# Patient Record
Sex: Female | Born: 1970 | Race: White | Hispanic: No | Marital: Married | Smoking: Never smoker
Health system: Southern US, Community
[De-identification: ages and names within clinical notes are randomized; demographics above are authoritative.]

## PROBLEM LIST (undated history)

## (undated) DIAGNOSIS — J8410 Pulmonary fibrosis, unspecified: Secondary | ICD-10-CM

## (undated) DIAGNOSIS — N200 Calculus of kidney: Secondary | ICD-10-CM

## (undated) DIAGNOSIS — J45909 Unspecified asthma, uncomplicated: Secondary | ICD-10-CM

## (undated) DIAGNOSIS — I10 Essential (primary) hypertension: Secondary | ICD-10-CM

## (undated) DIAGNOSIS — F32A Depression, unspecified: Secondary | ICD-10-CM

## (undated) HISTORY — PX: CHOLECYSTECTOMY: SHX55

## (undated) HISTORY — DX: Pulmonary fibrosis, unspecified: J84.10

## (undated) HISTORY — PX: CERVICAL SPINE SURGERY: SHX589

## (undated) HISTORY — DX: Calculus of kidney: N20.0

## (undated) HISTORY — PX: OTHER SURGICAL HISTORY: SHX169

## (undated) HISTORY — PX: BLADDER SURGERY: SHX569

## (undated) HISTORY — PX: CARPAL TUNNEL RELEASE: SHX101

## (undated) HISTORY — PX: LITHOTRIPSY: SUR834

---

## 2015-06-22 DIAGNOSIS — E781 Pure hyperglyceridemia: Secondary | ICD-10-CM | POA: Insufficient documentation

## 2015-06-22 DIAGNOSIS — F411 Generalized anxiety disorder: Secondary | ICD-10-CM | POA: Insufficient documentation

## 2015-06-22 DIAGNOSIS — F5101 Primary insomnia: Secondary | ICD-10-CM | POA: Insufficient documentation

## 2015-08-30 DIAGNOSIS — R519 Headache, unspecified: Secondary | ICD-10-CM | POA: Insufficient documentation

## 2015-12-17 DIAGNOSIS — E039 Hypothyroidism, unspecified: Secondary | ICD-10-CM | POA: Insufficient documentation

## 2016-05-19 DIAGNOSIS — N200 Calculus of kidney: Secondary | ICD-10-CM | POA: Insufficient documentation

## 2016-05-19 DIAGNOSIS — N3281 Overactive bladder: Secondary | ICD-10-CM | POA: Insufficient documentation

## 2016-06-10 DIAGNOSIS — E8941 Symptomatic postprocedural ovarian failure: Secondary | ICD-10-CM | POA: Insufficient documentation

## 2016-06-10 DIAGNOSIS — Z803 Family history of malignant neoplasm of breast: Secondary | ICD-10-CM | POA: Insufficient documentation

## 2017-01-07 DIAGNOSIS — I10 Essential (primary) hypertension: Secondary | ICD-10-CM | POA: Insufficient documentation

## 2017-12-15 DIAGNOSIS — R102 Pelvic and perineal pain: Secondary | ICD-10-CM | POA: Insufficient documentation

## 2017-12-15 DIAGNOSIS — G8929 Other chronic pain: Secondary | ICD-10-CM | POA: Insufficient documentation

## 2018-08-04 HISTORY — PX: OTHER SURGICAL HISTORY: SHX169

## 2018-11-29 DIAGNOSIS — K5900 Constipation, unspecified: Secondary | ICD-10-CM | POA: Insufficient documentation

## 2019-04-22 ENCOUNTER — Other Ambulatory Visit: Payer: Self-pay

## 2019-04-22 MED ORDER — ESCITALOPRAM OXALATE 20 MG PO TABS: 20.0000 mg | ORAL_TABLET | Freq: Every day | ORAL | 0 refills | Status: DC

## 2019-04-25 ENCOUNTER — Ambulatory Visit (INDEPENDENT_AMBULATORY_CARE_PROVIDER_SITE_OTHER): Payer: BLUE CROSS/BLUE SHIELD | Admitting: Adult Health

## 2019-04-25 ENCOUNTER — Encounter: Payer: Self-pay | Admitting: Adult Health

## 2019-04-25 ENCOUNTER — Other Ambulatory Visit: Payer: Self-pay

## 2019-04-25 DIAGNOSIS — F331 Major depressive disorder, recurrent, moderate: Secondary | ICD-10-CM | POA: Diagnosis not present

## 2019-04-25 DIAGNOSIS — F411 Generalized anxiety disorder: Secondary | ICD-10-CM

## 2019-04-25 DIAGNOSIS — G4700 Insomnia, unspecified: Secondary | ICD-10-CM | POA: Diagnosis not present

## 2019-04-25 DIAGNOSIS — F909 Attention-deficit hyperactivity disorder, unspecified type: Secondary | ICD-10-CM

## 2019-04-25 MED ORDER — BUPROPION HCL ER (XL) 300 MG PO TB24: ORAL_TABLET | ORAL | 5 refills | Status: DC

## 2019-04-25 MED ORDER — AMPHETAMINE-DEXTROAMPHETAMINE 20 MG PO TABS: 20.0000 mg | ORAL_TABLET | Freq: Every day | ORAL | 0 refills | Status: DC

## 2019-04-25 MED ORDER — ESCITALOPRAM OXALATE 20 MG PO TABS: 20.0000 mg | ORAL_TABLET | Freq: Every day | ORAL | 5 refills | Status: DC

## 2019-04-25 MED ORDER — CLONAZEPAM 0.5 MG PO TABS: ORAL_TABLET | ORAL | 2 refills | Status: DC

## 2019-04-25 MED ORDER — QUETIAPINE FUMARATE 100 MG PO TABS: ORAL_TABLET | ORAL | 5 refills | Status: DC

## 2019-04-25 NOTE — Progress Notes (Signed)
Olivia [name] [id number] 03/18/1971 48 y.o.  Virtual Visit via Telephone Note  I connected with pt on 04/25/19 at  3:00 PM EDT by telephone and verified that I am speaking with the correct person using two identifiers.   I discussed the limitations, risks, security and privacy concerns of performing an evaluation and management service by telephone and the availability of in person appointments. I also discussed with the patient that there may be a patient responsible charge related to this service. The patient expressed understanding and agreed to proceed.   I discussed the assessment and treatment plan with the patient. The patient was provided an opportunity to ask questions and all were answered. The patient agreed with the plan and demonstrated an understanding of the instructions.   The patient was advised to call back or seek an in-person evaluation if the symptoms worsen or if the condition fails to improve as anticipated.  I provided 30 minutes of non-face-to-face time during this encounter.  The patient was located at home.  The provider was located at [facility].   Dorothyann Gibbs, NP   Subjective:   Patient ID:  Olivia [name] is a 48 y.o. (DOB [birth year]) female.  Chief Complaint: No chief complaint on file.   HPI Taleasha Luttrull presents for follow-up of anxiety, depression, ADHD, and insomnia.   Describes mood today as "ok". Pleasant. Mood symptoms - depression, anxiety, and irritability. Stating "I'm under a lot stress right now". Increased financial stressors. Business closed for 3 months. Trying to "get back on my feet again" Stable interest and motivation. Taking medications as prescribed.  Energy levels stable. Active, does not have a regular exercise routine. Works full-time as a Producer, television/film/video. Enjoys some usual interests and activities. Spending time with family - husband and children. Talking to family and friends.  Appetite adequate. Weight stable. Sleeps  well most nights. Averages 6 to 8 hours. Focus and concentration stable - uses Adderall two days a week when doing office work. Completing tasks. Managing aspects of household.  Denies SI or HI. Denies AH or VH.  Review of Systems:  Review of Systems  Musculoskeletal: Negative for gait problem.  Neurological: Negative for tremors.  Psychiatric/Behavioral:       Please refer to HPI    Medications: I have reviewed the patient's current medications.  Current Outpatient Medications  Medication Sig Dispense Refill  . escitalopram (LEXAPRO) 20 MG tablet take 1 tablet by mouth once daily    . amphetamine-dextroamphetamine (ADDERALL) 20 MG tablet Take 1 tablet (20 mg total) by mouth daily. 30 tablet 0  . buPROPion (WELLBUTRIN XL) 300 MG 24 hr tablet TK 1 T PO D 30 tablet 5  . clonazePAM (KLONOPIN) 0.5 MG tablet TK 1 T PO TID PRN 90 tablet 2  . escitalopram (LEXAPRO) 20 MG tablet Take 1 tablet (20 mg total) by mouth daily. 30 tablet 5  . estradiol (ESTRACE) 1 MG tablet     . estradiol (VIVELLE-DOT) 0.025 MG/24HR     . NP THYROID 120 MG tablet TK 1 T PO QD OES    . progesterone (PROMETRIUM) 100 MG capsule     . QUEtiapine (SEROQUEL) 100 MG tablet TK 1-2 T PO PRN FOR SLEEP    . QUEtiapine (SEROQUEL) 100 MG tablet Take one to two tablets at bedtime. 60 tablet 5  . TRULANCE 3 MG TABS      No current facility-administered medications for this visit.     Medication Side Effects: None  Allergies: Not on File  No past medical history on file.  No family history on file.  Social History   Socioeconomic History  . Marital status: Unknown    Spouse name: Not on file  . Number of children: Not on file  . Years of education: Not on file  . Highest education level: Not on file  Occupational History  . Not on file  Social Needs  . Financial resource strain: Not on file  . Food insecurity    Worry: Not on file    Inability: Not on file  . Transportation needs    Medical: Not on file     Non-medical: Not on file  Tobacco Use  . Smoking status: Not on file  Substance and Sexual Activity  . Alcohol use: Not on file  . Drug use: Not on file  . Sexual activity: Not on file  Lifestyle  . Physical activity    Days per week: Not on file    Minutes per session: Not on file  . Stress: Not on file  Relationships  . Social Herbalist on phone: Not on file    Gets together: Not on file    Attends religious service: Not on file    Active member of club or organization: Not on file    Attends meetings of clubs or organizations: Not on file    Relationship status: Not on file  . Intimate partner violence    Fear of current or ex partner: Not on file    Emotionally abused: Not on file    Physically abused: Not on file    Forced sexual activity: Not on file  Other Topics Concern  . Not on file  Social History Narrative  . Not on file    Past Medical History, Surgical history, Social history, and Family history were reviewed and updated as appropriate.   Please see review of systems for further details on the patient's review from today.   Objective:   Physical Exam:  There were no vitals taken for this visit.  Physical Exam Constitutional:      General: She is not in acute distress.    Appearance: She is well-developed.  Musculoskeletal:        General: No deformity.  Neurological:     Mental Status: She is alert and oriented to person, place, and time.     Coordination: Coordination normal.  Psychiatric:        Attention and Perception: Attention and perception normal. She does not perceive auditory or visual hallucinations.        Mood and Affect: Mood normal. Mood is not anxious or depressed. Affect is not labile, blunt, angry or inappropriate.        Speech: Speech normal.        Behavior: Behavior normal.        Thought Content: Thought content normal. Thought content is not paranoid or delusional. Thought content does not include homicidal or  suicidal ideation. Thought content does not include homicidal or suicidal plan.        Cognition and Memory: Cognition and memory normal.        Judgment: Judgment normal.     Comments: Insight intact     Lab Review:  No results found for: NA, K, CL, CO2, GLUCOSE, BUN, CREATININE, CALCIUM, PROT, ALBUMIN, AST, ALT, ALKPHOS, BILITOT, GFRNONAA, GFRAA  No results found for: WBC, RBC, HGB, HCT, PLT, MCV, MCH, MCHC, RDW, LYMPHSABS, MONOABS,  EOSABS, BASOSABS  No results found for: POCLITH, LITHIUM   No results found for: PHENYTOIN, PHENOBARB, VALPROATE, CBMZ   .res Assessment: Plan:    Plan:  1. Lexapro 20mg  daily 2. Seroquel 100mg  - 2 at hs  3. Clonazepam 0.5mg  TID 4. Wellbutrin XL 300mg  daily 5. Adderall 20mg  daily  RTC 3/6 months  Patient advised to contact office with any questions, adverse effects, or acute worsening in signs and symptoms.  Discussed potential benefits, risk, and side effects of benzodiazepines to include potential risk of tolerance and dependence, as well as possible drowsiness.  Advised patient not to drive if experiencing drowsiness and to take lowest possible effective dose to minimize risk of dependence and tolerance.  Discussed potential metabolic side effects associated with atypical antipsychotics, as well as potential risk for movement side effects. Advised pt to contact office if movement side effects occur.   Discussed potential benefits, risks, and side effects of stimulants with patient to include increased heart rate, palpitations, insomnia, increased anxiety, increased irritability, or decreased appetite.  Instructed patient to contact office if experiencing any significant tolerability issues.  Diagnoses and all orders for this visit:  Generalized anxiety disorder -     escitalopram (LEXAPRO) 20 MG tablet; Take 1 tablet (20 mg total) by mouth daily. -     clonazePAM (KLONOPIN) 0.5 MG tablet; TK 1 T PO TID PRN  Major depressive disorder,  recurrent episode, moderate (HCC) -     escitalopram (LEXAPRO) 20 MG tablet; Take 1 tablet (20 mg total) by mouth daily. -     buPROPion (WELLBUTRIN XL) 300 MG 24 hr tablet; TK 1 T PO D  Insomnia, unspecified type -     QUEtiapine (SEROQUEL) 100 MG tablet; Take one to two tablets at bedtime.  Attention deficit hyperactivity disorder (ADHD), unspecified ADHD type -     amphetamine-dextroamphetamine (ADDERALL) 20 MG tablet; Take 1 tablet (20 mg total) by mouth daily.    Please see After Visit Summary for patient specific instructions.  No future appointments.  No orders of the defined types were placed in this encounter.     -------------------------------

## 2019-08-02 ENCOUNTER — Other Ambulatory Visit: Payer: Self-pay | Admitting: Adult Health

## 2019-08-02 DIAGNOSIS — G4700 Insomnia, unspecified: Secondary | ICD-10-CM

## 2019-08-02 MED ORDER — QUETIAPINE FUMARATE 100 MG PO TABS: ORAL_TABLET | ORAL | 5 refills | Status: DC

## 2019-08-02 NOTE — Telephone Encounter (Signed)
Script sent  

## 2019-08-02 NOTE — Telephone Encounter (Signed)
Olivia [name] called to request refill of her Seroquel.  She is taking 2-3 qHS vs 1 qHS.  So she is out early and can't refill without new prescription.  Please send in new script with new dose to [facility] on [address] in [place].  No appt scheduled at this time.

## 2019-10-26 ENCOUNTER — Encounter: Payer: Self-pay | Admitting: Adult Health

## 2019-10-26 ENCOUNTER — Other Ambulatory Visit: Payer: Self-pay

## 2019-10-26 ENCOUNTER — Ambulatory Visit (INDEPENDENT_AMBULATORY_CARE_PROVIDER_SITE_OTHER): Payer: BLUE CROSS/BLUE SHIELD | Admitting: Adult Health

## 2019-10-26 DIAGNOSIS — F331 Major depressive disorder, recurrent, moderate: Secondary | ICD-10-CM

## 2019-10-26 DIAGNOSIS — F411 Generalized anxiety disorder: Secondary | ICD-10-CM

## 2019-10-26 DIAGNOSIS — G4700 Insomnia, unspecified: Secondary | ICD-10-CM | POA: Diagnosis not present

## 2019-10-26 DIAGNOSIS — F909 Attention-deficit hyperactivity disorder, unspecified type: Secondary | ICD-10-CM

## 2019-10-26 MED ORDER — QUETIAPINE FUMARATE 100 MG PO TABS: ORAL_TABLET | ORAL | 5 refills | Status: DC

## 2019-10-26 MED ORDER — BUPROPION HCL ER (XL) 300 MG PO TB24: ORAL_TABLET | ORAL | 5 refills | Status: DC

## 2019-10-26 MED ORDER — CLONAZEPAM 0.5 MG PO TABS: ORAL_TABLET | ORAL | 2 refills | Status: DC

## 2019-10-26 MED ORDER — AMPHETAMINE-DEXTROAMPHETAMINE 20 MG PO TABS: 20.0000 mg | ORAL_TABLET | Freq: Every day | ORAL | 0 refills | Status: DC

## 2019-10-26 MED ORDER — ESCITALOPRAM OXALATE 20 MG PO TABS: 20.0000 mg | ORAL_TABLET | Freq: Every day | ORAL | 5 refills | Status: DC

## 2019-10-26 NOTE — Progress Notes (Signed)
Olivia [name] [id number] 1970-10-29 49 y.o.  Virtual Visit via Telephone Note  I connected with pt on 10/26/19 at  2:20 PM EDT by telephone and verified that I am speaking with the correct person using two identifiers.   I discussed the limitations, risks, security and privacy concerns of performing an evaluation and management service by telephone and the availability of in person appointments. I also discussed with the patient that there may be a patient responsible charge related to this service. The patient expressed understanding and agreed to proceed.   I discussed the assessment and treatment plan with the patient. The patient was provided an opportunity to ask questions and all were answered. The patient agreed with the plan and demonstrated an understanding of the instructions.   The patient was advised to call back or seek an in-person evaluation if the symptoms worsen or if the condition fails to improve as anticipated.  I provided 30 minutes of non-face-to-face time during this encounter.  The patient was located at home.  The provider was located at [facility].   Dorothyann Gibbs, NP   Subjective:   Patient ID:  Olivia [name] is a 49 y.o. (DOB Apr 28, 1971) female.  Chief Complaint: No chief complaint on file.   HPI [name] presents for follow-up of anxiety, depression, ADHD, and insomnia.   Describes mood today as "ok". Pleasant. Mood symptoms - reports depression, anxiety, and irritability "sometimes".  Stating "I'm mostly doing alright". Recent fall with broken ribs. 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 Daughter going through a divorce. Financial stressors. Stable interest and motivation. Taking medications as prescribed.  Energy levels stable. Active, does not have a regular exercise routine currently, but had been going to the gym until her recent fall. Works full-time as a Producer, television/film/video. Enjoys some usual interests and activities. Married.  Lives with husband of 32 years. 2 daughters. Talking to family and friends.  Appetite adequate. Weight stable - 153. Sleeps well most nights. Averages 7 to 8 hours. Focus and concentration stable with Adderall. Completing tasks. Managing aspects of household. Work going well.  Denies SI or HI. Denies AH or VH.  Review of Systems:  Review of Systems  Musculoskeletal: Negative for gait problem.  Neurological: Negative for tremors.  Psychiatric/Behavioral:       Please refer to HPI    Medications: I have reviewed the patient's current medications.  Current Outpatient Medications  Medication Sig Dispense Refill  . amphetamine-dextroamphetamine (ADDERALL) 20 MG tablet Take 1 tablet (20 mg total) by mouth daily. 30 tablet 0  . buPROPion (WELLBUTRIN XL) 300 MG 24 hr tablet TK 1 T PO D 30 tablet 5  . clonazePAM (KLONOPIN) 0.5 MG tablet TK 1 T PO TID PRN 90 tablet 2  . escitalopram (LEXAPRO) 20 MG tablet take 1 tablet by mouth once daily    . escitalopram (LEXAPRO) 20 MG tablet Take 1 tablet (20 mg total) by mouth daily. 30 tablet 5  . estradiol (ESTRACE) 1 MG tablet     . estradiol (VIVELLE-DOT) 0.025 MG/24HR     . NP THYROID 120 MG tablet TK 1 T PO QD OES    . progesterone (PROMETRIUM) 100 MG capsule     . QUEtiapine (SEROQUEL) 100 MG tablet TK 1-2 T PO PRN FOR SLEEP    . QUEtiapine (SEROQUEL) 100 MG tablet Take two to three tablets at bedtime. 90 tablet 5  . TRULANCE 3 MG TABS      No  current facility-administered medications for this visit.    Medication Side Effects: None  Allergies:  Allergies  Allergen Reactions  . Other Rash  . Sulfa Antibiotics Rash    All over body  . Sulfasalazine Rash  . Silicone Rash    Silicone strips per pt    No past medical history on file.  No family history on file.  Social History   Socioeconomic History  . Marital status: Unknown    Spouse name: Not on file  . Number of children: Not on file  . Years of education: Not on file  .  Highest education level: Not on file  Occupational History  . Not on file  Tobacco Use  . Smoking status: Never Smoker  . Smokeless tobacco: Never Used  Substance and Sexual Activity  . Alcohol use: Not on file  . Drug use: Not on file  . Sexual activity: Not on file  Other Topics Concern  . Not on file  Social History Narrative  . Not on file   Social Determinants of Health   Financial Resource Strain:   . Difficulty of Paying Living Expenses:   Food Insecurity:   . Worried About Programme researcher, broadcasting/film/video in the Last Year:   . Barista in the Last Year:   Transportation Needs:   . Freight forwarder (Medical):   Marland Kitchen Lack of Transportation (Non-Medical):   Physical Activity:   . Days of Exercise per Week:   . Minutes of Exercise per Session:   Stress:   . Feeling of Stress :   Social Connections:   . Frequency of Communication with Friends and Family:   . Frequency of Social Gatherings with Friends and Family:   . Attends Religious Services:   . Active Member of Clubs or Organizations:   . Attends Banker Meetings:   Marland Kitchen Marital Status:   Intimate Partner Violence:   . Fear of Current or Ex-Partner:   . Emotionally Abused:   Marland Kitchen Physically Abused:   . Sexually Abused:     Past Medical History, Surgical history, Social history, and Family history were reviewed and updated as appropriate.   Please see review of systems for further details on the patient's review from today.   Objective:   Physical Exam:  There were no vitals taken for this visit.  Physical Exam Neurological:     Mental Status: She is alert and oriented to person, place, and time.     Cranial Nerves: No dysarthria.  Psychiatric:        Attention and Perception: Attention and perception normal.        Mood and Affect: Mood normal.        Speech: Speech normal.        Behavior: Behavior is cooperative.        Thought Content: Thought content normal. Thought content is not paranoid  or delusional. Thought content does not include homicidal or suicidal ideation. Thought content does not include homicidal or suicidal plan.        Cognition and Memory: Cognition and memory normal.        Judgment: Judgment normal.     Comments: Insight intact     Lab Review:  No results found for: NA, K, CL, CO2, GLUCOSE, BUN, CREATININE, CALCIUM, PROT, ALBUMIN, AST, ALT, ALKPHOS, BILITOT, GFRNONAA, GFRAA  No results found for: WBC, RBC, HGB, HCT, PLT, MCV, MCH, MCHC, RDW, LYMPHSABS, MONOABS, EOSABS, BASOSABS  No results found  for: POCLITH, LITHIUM   No results found for: PHENYTOIN, PHENOBARB, VALPROATE, CBMZ   .res Assessment: Plan:    Plan:  1. Lexapro 20mg  daily 2. Seroquel 100mg  - 2 to 3 hs  3. Clonazepam 0.5mg  TID 4. Wellbutrin XL 300mg  daily 5. Adderall 20mg  daily  RTC 3/6 months  Patient advised to contact office with any questions, adverse effects, or acute worsening in signs and symptoms.  Discussed potential benefits, risk, and side effects of benzodiazepines to include potential risk of tolerance and dependence, as well as possible drowsiness.  Advised patient not to drive if experiencing drowsiness and to take lowest possible effective dose to minimize risk of dependence and tolerance.  Discussed potential metabolic side effects associated with atypical antipsychotics, as well as potential risk for movement side effects. Advised pt to contact office if movement side effects occur.   Discussed potential benefits, risks, and side effects of stimulants with patient to include increased heart rate, palpitations, insomnia, increased anxiety, increased irritability, or decreased appetite.  Instructed patient to contact office if experiencing any significant tolerability issues.   There are no diagnoses linked to this encounter.  Please see After Visit Summary for patient specific instructions.  No future appointments.  No orders of the defined types were placed in  this encounter.     -------------------------------

## 2020-01-04 ENCOUNTER — Other Ambulatory Visit: Payer: Self-pay

## 2020-01-04 DIAGNOSIS — F411 Generalized anxiety disorder: Secondary | ICD-10-CM

## 2020-01-04 MED ORDER — CLONAZEPAM 0.5 MG PO TABS: ORAL_TABLET | ORAL | 2 refills | Status: DC

## 2020-03-09 ENCOUNTER — Encounter (HOSPITAL_BASED_OUTPATIENT_CLINIC_OR_DEPARTMENT_OTHER): Payer: Self-pay | Admitting: Emergency Medicine

## 2020-03-09 ENCOUNTER — Emergency Department (HOSPITAL_BASED_OUTPATIENT_CLINIC_OR_DEPARTMENT_OTHER): Payer: BLUE CROSS/BLUE SHIELD

## 2020-03-09 ENCOUNTER — Other Ambulatory Visit: Payer: Self-pay

## 2020-03-09 ENCOUNTER — Emergency Department (HOSPITAL_BASED_OUTPATIENT_CLINIC_OR_DEPARTMENT_OTHER)
Admission: EM | Admit: 2020-03-09 | Discharge: 2020-03-09 | Disposition: A | Discharge status: Home / Self Care | Payer: BLUE CROSS/BLUE SHIELD | Attending: Emergency Medicine | Admitting: Emergency Medicine

## 2020-03-09 DIAGNOSIS — R531 Weakness: Secondary | ICD-10-CM | POA: Diagnosis not present

## 2020-03-09 DIAGNOSIS — J45909 Unspecified asthma, uncomplicated: Secondary | ICD-10-CM | POA: Diagnosis not present

## 2020-03-09 DIAGNOSIS — R0981 Nasal congestion: Secondary | ICD-10-CM | POA: Insufficient documentation

## 2020-03-09 DIAGNOSIS — M791 Myalgia, unspecified site: Secondary | ICD-10-CM | POA: Insufficient documentation

## 2020-03-09 DIAGNOSIS — R519 Headache, unspecified: Secondary | ICD-10-CM | POA: Diagnosis not present

## 2020-03-09 DIAGNOSIS — R509 Fever, unspecified: Secondary | ICD-10-CM | POA: Insufficient documentation

## 2020-03-09 DIAGNOSIS — R0982 Postnasal drip: Secondary | ICD-10-CM | POA: Insufficient documentation

## 2020-03-09 DIAGNOSIS — R05 Cough: Secondary | ICD-10-CM | POA: Insufficient documentation

## 2020-03-09 DIAGNOSIS — R059 Cough, unspecified: Secondary | ICD-10-CM

## 2020-03-09 DIAGNOSIS — Z79899 Other long term (current) drug therapy: Secondary | ICD-10-CM | POA: Diagnosis not present

## 2020-03-09 DIAGNOSIS — U071 COVID-19: Secondary | ICD-10-CM | POA: Insufficient documentation

## 2020-03-09 DIAGNOSIS — R0602 Shortness of breath: Secondary | ICD-10-CM | POA: Diagnosis present

## 2020-03-09 DIAGNOSIS — R197 Diarrhea, unspecified: Secondary | ICD-10-CM | POA: Diagnosis not present

## 2020-03-09 DIAGNOSIS — R112 Nausea with vomiting, unspecified: Secondary | ICD-10-CM | POA: Diagnosis not present

## 2020-03-09 DIAGNOSIS — I10 Essential (primary) hypertension: Secondary | ICD-10-CM | POA: Insufficient documentation

## 2020-03-09 HISTORY — DX: Depression, unspecified: F32.A

## 2020-03-09 HISTORY — DX: Unspecified asthma, uncomplicated: J45.909

## 2020-03-09 HISTORY — DX: Essential (primary) hypertension: I10

## 2020-03-09 LAB — CBC WITH DIFFERENTIAL/PLATELET
Abs Immature Granulocytes: 0.04 10*3/uL (ref 0.00–0.07)
Basophils Absolute: 0 10*3/uL (ref 0.0–0.1)
Basophils Relative: 0 %
Eosinophils Absolute: 0 10*3/uL (ref 0.0–0.5)
Eosinophils Relative: 0 %
HCT: 44.2 % (ref 36.0–46.0)
Hemoglobin: 14.3 g/dL (ref 12.0–15.0)
Immature Granulocytes: 1 %
Lymphocytes Relative: 6 %
Lymphs Abs: 0.5 10*3/uL — ABNORMAL LOW (ref 0.7–4.0)
MCH: 30.8 pg (ref 26.0–34.0)
MCHC: 32.4 g/dL (ref 30.0–36.0)
MCV: 95.3 fL (ref 80.0–100.0)
Monocytes Absolute: 0.4 10*3/uL (ref 0.1–1.0)
Monocytes Relative: 5 %
Neutro Abs: 7.2 10*3/uL (ref 1.7–7.7)
Neutrophils Relative %: 88 %
Platelets: 218 10*3/uL (ref 150–400)
RBC: 4.64 MIL/uL (ref 3.87–5.11)
RDW: 13.3 % (ref 11.5–15.5)
WBC: 8.1 10*3/uL (ref 4.0–10.5)
nRBC: 0 % (ref 0.0–0.2)

## 2020-03-09 LAB — URINALYSIS, ROUTINE W REFLEX MICROSCOPIC
Bilirubin Urine: NEGATIVE
Glucose, UA: NEGATIVE mg/dL
Hgb urine dipstick: NEGATIVE
Ketones, ur: NEGATIVE mg/dL
Leukocytes,Ua: NEGATIVE
Nitrite: NEGATIVE
Protein, ur: 30 mg/dL — AB
Specific Gravity, Urine: >1.03 — ABNORMAL HIGH (ref 1.005–1.030)
pH: 5.5 (ref 5.0–8.0)

## 2020-03-09 LAB — COMPREHENSIVE METABOLIC PANEL
ALT: 123 U/L — ABNORMAL HIGH (ref 0–44)
AST: 87 U/L — ABNORMAL HIGH (ref 15–41)
Albumin: 3.8 g/dL (ref 3.5–5.0)
Alkaline Phosphatase: 90 U/L (ref 38–126)
Anion gap: 11 (ref 5–15)
BUN: 16 mg/dL (ref 6–20)
CO2: 21 mmol/L — ABNORMAL LOW (ref 22–32)
Calcium: 8.3 mg/dL — ABNORMAL LOW (ref 8.9–10.3)
Chloride: 102 mmol/L (ref 98–111)
Creatinine, Ser: 1.05 mg/dL — ABNORMAL HIGH (ref 0.44–1.00)
GFR calc Af Amer: >60 mL/min (ref >60)
GFR calc non Af Amer: >60 mL/min (ref >60)
Glucose, Bld: 104 mg/dL — ABNORMAL HIGH (ref 70–99)
Potassium: 3.9 mmol/L (ref 3.5–5.1)
Sodium: 134 mmol/L — ABNORMAL LOW (ref 135–145)
Total Bilirubin: 0.2 mg/dL — ABNORMAL LOW (ref 0.3–1.2)
Total Protein: 7.1 g/dL (ref 6.5–8.1)

## 2020-03-09 LAB — HCG, QUANTITATIVE, PREGNANCY: hCG, Beta Chain, Quant, S: 4 m[IU]/mL (ref <5)

## 2020-03-09 LAB — URINALYSIS, MICROSCOPIC (REFLEX)

## 2020-03-09 LAB — LACTATE DEHYDROGENASE: LDH: 172 U/L (ref 98–192)

## 2020-03-09 LAB — TRIGLYCERIDES: Triglycerides: 156 mg/dL — ABNORMAL HIGH (ref <150)

## 2020-03-09 LAB — ACETAMINOPHEN LEVEL: Acetaminophen (Tylenol), Serum: 16 ug/mL (ref 10–30)

## 2020-03-09 LAB — FERRITIN: Ferritin: 252 ng/mL (ref 11–307)

## 2020-03-09 LAB — LACTIC ACID, PLASMA: Lactic Acid, Venous: 1.3 mmol/L (ref 0.5–1.9)

## 2020-03-09 LAB — PROCALCITONIN: Procalcitonin: 62.61 ng/mL

## 2020-03-09 LAB — C-REACTIVE PROTEIN: CRP: 12.7 mg/dL — ABNORMAL HIGH (ref <1.0)

## 2020-03-09 LAB — FIBRINOGEN: Fibrinogen: 512 mg/dL — ABNORMAL HIGH (ref 210–475)

## 2020-03-09 LAB — D-DIMER, QUANTITATIVE: D-Dimer, Quant: 0.41 ug/mL-FEU (ref 0.00–0.50)

## 2020-03-09 MED ORDER — PROCHLORPERAZINE EDISYLATE 10 MG/2ML IJ SOLN
10.0000 mg | Freq: Once | INTRAMUSCULAR | Status: AC
  Administered 2020-03-09: 10 mg via INTRAVENOUS
  Filled 2020-03-09: qty 2

## 2020-03-09 MED ORDER — ONDANSETRON HCL 4 MG/2ML IJ SOLN
4.0000 mg | Freq: Once | INTRAMUSCULAR | Status: AC
  Administered 2020-03-09: 4 mg via INTRAVENOUS
  Filled 2020-03-09: qty 2

## 2020-03-09 MED ORDER — DEXAMETHASONE 6 MG PO TABS
6.0000 mg | ORAL_TABLET | Freq: Every day | ORAL | 0 refills | Status: AC
Start: 2020-03-09 — End: 2020-03-13

## 2020-03-09 MED ORDER — HYDROMORPHONE HCL 1 MG/ML IJ SOLN
1.0000 mg | Freq: Once | INTRAMUSCULAR | Status: AC
  Administered 2020-03-09: 1 mg via INTRAVENOUS

## 2020-03-09 MED ORDER — DEXAMETHASONE SODIUM PHOSPHATE 10 MG/ML IJ SOLN
10.0000 mg | Freq: Once | INTRAMUSCULAR | Status: AC
  Administered 2020-03-09: 10 mg via INTRAVENOUS
  Filled 2020-03-09: qty 1

## 2020-03-09 MED ORDER — SODIUM CHLORIDE 0.9 % IV SOLN
1000.0000 mL | INTRAVENOUS | Status: DC
  Administered 2020-03-09: 1000 mL via INTRAVENOUS

## 2020-03-09 MED ORDER — DIPHENHYDRAMINE HCL 25 MG PO TABS
25.0000 mg | ORAL_TABLET | Freq: Four times a day (QID) | ORAL | 0 refills | Status: DC | PRN
Start: 2020-03-09 — End: 2023-11-18

## 2020-03-09 MED ORDER — BENZONATATE 100 MG PO CAPS: 100.0000 mg | ORAL_CAPSULE | Freq: Three times a day (TID) | ORAL | 0 refills | Status: DC

## 2020-03-09 MED ORDER — SODIUM CHLORIDE 0.9 % IV BOLUS
1000.0000 mL | Freq: Once | INTRAVENOUS | Status: AC
  Administered 2020-03-09: 1000 mL via INTRAVENOUS

## 2020-03-09 MED ORDER — ACETAMINOPHEN 500 MG PO TABS
1000.0000 mg | ORAL_TABLET | Freq: Once | ORAL | Status: AC
  Administered 2020-03-09: 1000 mg via ORAL
  Filled 2020-03-09: qty 2

## 2020-03-09 MED ORDER — MORPHINE SULFATE (PF) 4 MG/ML IV SOLN
4.0000 mg | Freq: Once | INTRAVENOUS | Status: AC
  Administered 2020-03-09: 4 mg via INTRAVENOUS
  Filled 2020-03-09: qty 1

## 2020-03-09 MED ORDER — FENTANYL CITRATE (PF) 100 MCG/2ML IJ SOLN: 50.0000 ug | Freq: Once | INTRAMUSCULAR | Status: DC

## 2020-03-09 MED ORDER — HYDROMORPHONE HCL 1 MG/ML IJ SOLN
1.0000 mg | Freq: Once | INTRAMUSCULAR | Status: DC
  Filled 2020-03-09: qty 1

## 2020-03-09 MED ORDER — METOCLOPRAMIDE HCL 10 MG PO TABS: 10.0000 mg | ORAL_TABLET | Freq: Four times a day (QID) | ORAL | 0 refills | Status: DC

## 2020-03-09 MED ORDER — FLUTICASONE PROPIONATE 50 MCG/ACT NA SUSP
2.0000 | Freq: Every day | NASAL | 0 refills | Status: DC
Start: 2020-03-09 — End: 2023-11-18

## 2020-03-09 MED ORDER — KETOROLAC TROMETHAMINE 30 MG/ML IJ SOLN
30.0000 mg | Freq: Once | INTRAMUSCULAR | Status: AC
  Administered 2020-03-09: 30 mg via INTRAVENOUS
  Filled 2020-03-09: qty 1

## 2020-03-09 MED ORDER — DIPHENHYDRAMINE HCL 50 MG/ML IJ SOLN
25.0000 mg | Freq: Once | INTRAMUSCULAR | Status: AC
  Administered 2020-03-09: 25 mg via INTRAVENOUS
  Filled 2020-03-09: qty 1

## 2020-03-09 MED ORDER — LACTATED RINGERS IV BOLUS
1000.0000 mL | Freq: Once | INTRAVENOUS | Status: AC
  Administered 2020-03-09: 1000 mL via INTRAVENOUS

## 2020-03-09 MED ORDER — ZYRTEC ALLERGY 10 MG PO CAPS: ORAL_CAPSULE | ORAL | 0 refills | Status: DC

## 2020-03-09 NOTE — ED Notes (Signed)
Ambulated from room 12 to 11, SpO2 95-100% on r/a, HR 108-111, patient became weak upon standing from [facility] using handrail to steady with ambulation.

## 2020-03-09 NOTE — Discharge Instructions (Addendum)
Take the medications as prescribed.  Take the Reglan for vomiting or Headache.  May take Reglan, benadryl and Tylenol in combination for headache cocktail.  Take the Tessalon Pearls for your cough  Alternate Tylenol and Ibuprofen for fever. Do not take more than 2400 milligrams ibuprofen no more than 4000 mg Tylenol in a 24.  Continue taking the decadron over the next 4 days

## 2020-03-09 NOTE — ED Triage Notes (Signed)
Pt dx with COVID on Monday  Yesterday she could not control her temp and 103 and she started to vomit today feels weak

## 2020-03-09 NOTE — ED Provider Notes (Signed)
[facility] Provider Note   CSN: [id number] Arrival date & time: 03/09/20  1223    History SOB, Fever, COVID +  Olivia [name] is a 49 y.o. female with past medical history significant for anxiety, HTN, chronic HA, chronic pelvic pain who presents for evaluation of fever, SOB. COVID positive on Monday at [facility].  Has had multiple episodes of NBNB emesis.  Feels generally weak.  States was unable to control her temperature yesterday which was as high as 103.0.  Has been taking Tylenol.  Has had an intermittent headache since diagnosis.  No sudden onset thunderclap headache.  No facial droop, paresthesias, unilateral weakness.  Pain diffusely across forehead. Patient also with 2 episodes of diarrhea yesterday without melena or bright red per rectum.  She states she has no appetite.  When seen at [facility] they prescribed her Ivermectin, Decadron (took dose this AM), Norco for body aches and told to take additional Tylenol.  Patient has been taking Tylenol every 4 hours.  Has completed her Norco.  Sick family members as well as her place of employment.  Did NOT get COVID vaccine.  Patient denies chest pain, shortness of breath, abdominal pain, dysuria, rashes or lesions.  Unilateral leg swelling, redness or warmth.  No hemoptysis.  History obtained from patient and past medical records. No interpretor was used.  HPI     Past Medical History:  Diagnosis Date  . Asthma   . Depression   . Hypertension     Patient Active Problem List   Diagnosis Date Noted  . Constipation 11/29/2018  . Chronic pelvic pain in female 12/15/2017  . Essential hypertension 01/07/2017  . Symptomatic postsurgical menopause 06/10/2016  . Family history of breast cancer in sister 06/10/2016  . OAB (overactive bladder) 05/19/2016  . Renal calculus 05/19/2016  . Hypothyroidism, adult 12/17/2015  . Chronic headaches 08/30/2015  . Generalized anxiety disorder 06/22/2015  .  Hypertriglyceridemia 06/22/2015  . Primary insomnia 06/22/2015    History reviewed.   OB History   No obstetric history on file.     No family history on file.  Social History   Tobacco Use  . Smoking status: Never Smoker  . Smokeless tobacco: Never Used  Vaping Use  . Vaping Use: Never used  Substance Use Topics  . Alcohol use: Never  . Drug use: Never    Home Medications Prior to Admission medications   Medication Sig Start Date End Date Taking? Authorizing Provider  amphetamine-dextroamphetamine (ADDERALL) 20 MG tablet Take 1 tablet (20 mg total) by mouth daily. 10/26/19  Yes Mozingo, Berdie Ogren, NP  amphetamine-dextroamphetamine (ADDERALL) 20 MG tablet Take 1 tablet (20 mg total) by mouth daily. 11/23/19  Yes Mozingo, Berdie Ogren, NP  buPROPion (WELLBUTRIN XL) 300 MG 24 hr tablet TK 1 T PO D 10/26/19  Yes Mozingo, Berdie Ogren, NP  clonazePAM (KLONOPIN) 0.5 MG tablet TK 1 T PO TID PRN 01/04/20  Yes Mozingo, Berdie Ogren, NP  escitalopram (LEXAPRO) 20 MG tablet take 1 tablet by mouth once daily 06/22/15  Yes [provider]  escitalopram (LEXAPRO) 20 MG tablet Take 1 tablet (20 mg total) by mouth daily. 10/26/19  Yes Mozingo, Berdie Ogren, NP  estradiol (ESTRACE) 1 MG tablet  04/25/19  Yes [provider]  estradiol (VIVELLE-DOT) 0.025 MG/24HR  04/20/19  Yes [provider]  NP THYROID 120 MG tablet TK 1 T PO QD OES 04/17/19  Yes [provider]  progesterone (PROMETRIUM) 100 MG capsule  04/25/19  Yes [provider]  amphetamine-dextroamphetamine (ADDERALL) 20 MG tablet Take 1 tablet (20 mg total) by mouth daily. 12/21/19   Mozingo, Berdie Ogren, NP  benzonatate (TESSALON) 100 MG capsule Take 1 capsule (100 mg total) by mouth every 8 (eight) hours. 03/09/20   Finnbar Cedillos A, PA-C  Cetirizine HCl (ZYRTEC ALLERGY) 10 MG CAPS Take once daily 03/09/20   Maynard David A, PA-C  dexamethasone (DECADRON) 6 MG tablet Take 1  tablet (6 mg total) by mouth daily for 4 days. 03/09/20 03/13/20  Romolo Sieling A, PA-C  diphenhydrAMINE (BENADRYL) 25 MG tablet Take 1 tablet (25 mg total) by mouth every 6 (six) hours as needed. 03/09/20   Mong Neal A, PA-C  fluticasone (FLONASE) 50 MCG/ACT nasal spray Place 2 sprays into both nostrils daily. 03/09/20   Cris Talavera A, PA-C  metoCLOPramide (REGLAN) 10 MG tablet Take 1 tablet (10 mg total) by mouth every 6 (six) hours. 03/09/20   Andrienne Havener A, PA-C  NP THYROID 30 MG tablet Take 30 mg by mouth daily. 10/17/19   [provider]  QUEtiapine (SEROQUEL) 100 MG tablet Take two to three tablets at bedtime. 10/26/19   Mozingo, Berdie Ogren, NP  TRULANCE 3 MG TABS  02/26/19   [provider]    Allergies    Other, Sulfa antibiotics, Sulfasalazine, and Silicone  Review of Systems   Review of Systems  Constitutional: Positive for activity change, appetite change, fatigue and fever.  HENT: Negative.   Respiratory: Positive for cough. Negative for apnea, choking, chest tightness, shortness of breath, wheezing and stridor.   Cardiovascular: Negative.   Gastrointestinal: Positive for diarrhea, nausea and vomiting. Negative for abdominal distention, abdominal pain, anal bleeding, blood in stool, constipation and rectal pain.  Genitourinary: Negative.   Musculoskeletal: Positive for myalgias. Negative for arthralgias, back pain, gait problem, joint swelling, neck pain and neck stiffness.  Skin: Negative.   Neurological: Positive for weakness (Generalized). Negative for dizziness, tremors, seizures, syncope, facial asymmetry, speech difficulty, light-headedness, numbness and headaches.  All other systems reviewed and are negative.  Physical Exam Updated Vital Signs BP 120/75 Comment: rm air  Pulse 80 Comment: rm air  Temp 98.9 F (37.2 C) (Oral)   Resp 19 Comment: rm air  Ht 5' 2"  (1.575 m)   Wt 72.6 kg   SpO2 95% Comment: rm air  BMI 29.26 kg/m    Physical Exam Vitals and nursing note reviewed.  Constitutional:      General: She is not in acute distress.    Appearance: She is well-developed. She is not ill-appearing, toxic-appearing or diaphoretic.  HENT:     Head: Normocephalic and atraumatic.     Jaw: There is normal jaw occlusion.     Right Ear: Tympanic membrane, ear canal and external ear normal. There is no impacted cerumen. No hemotympanum. Tympanic membrane is not injected, scarred, perforated, erythematous, retracted or bulging.     Left Ear: Tympanic membrane, ear canal and external ear normal. There is no impacted cerumen. No hemotympanum. Tympanic membrane is not injected, scarred, perforated, erythematous, retracted or bulging.     Ears:     Comments: No Mastoid tenderness.    Nose: Congestion and rhinorrhea present. No laceration or mucosal edema. Rhinorrhea is clear.     Right Sinus: No maxillary sinus tenderness or frontal sinus tenderness.     Left Sinus: No maxillary sinus tenderness or frontal sinus tenderness.     Comments: Clear rhinnorhea. No sinus tenderness.  Mouth/Throat:     Lips: Pink.     Mouth: Mucous membranes are moist.     Pharynx: Oropharynx is clear. Uvula midline.     Tonsils: No tonsillar exudate or tonsillar abscesses. 0 on the right. 0 on the left.     Comments: Posterior oropharynx clear.  Mucous membranes moist.  Tonsils without erythema or exudate.  Uvula midline without deviation.  No evidence of PTA or RPA.  No drooling, dysphasia or trismus.  Phonation normal. Eyes:     Extraocular Movements: Extraocular movements intact.     Conjunctiva/sclera: Conjunctivae normal.     Pupils: Pupils are equal, round, and reactive to light.     Comments: EOM intact. No nystagmus  Neck:     Trachea: Phonation normal.     Meningeal: Brudzinski's sign and Kernig's sign absent.     Comments: No Neck stiffness or neck rigidity.  No meningismus.  No cervical lymphadenopathy. Cardiovascular:     Rate  and Rhythm: Normal rate.     Pulses: Normal pulses.          Radial pulses are 2+ on the right side and 2+ on the left side.       Dorsalis pedis pulses are 2+ on the right side and 2+ on the left side.       Posterior tibial pulses are 2+ on the right side and 2+ on the left side.     Heart sounds: Normal heart sounds.     Comments: No murmurs rubs or gallops. Pulmonary:     Effort: Pulmonary effort is normal. No tachypnea, accessory muscle usage or respiratory distress.     Breath sounds: Normal breath sounds and air entry. No stridor.     Comments: Clear to auscultation bilaterally without wheeze, rhonchi or rales.  No accessory muscle usage.  Able speak in full sentences without difficulty Chest:     Comments: Equal rise and fall of chest. No crepitus, edema, step offs Abdominal:     General: Bowel sounds are normal. There is no distension.     Palpations: Abdomen is soft.     Tenderness: There is no abdominal tenderness. There is no right CVA tenderness, left CVA tenderness or guarding. Negative signs include Murphy's sign and McBurney's sign.     Hernia: No hernia is present.     Comments: Soft, nontender without rebound or guarding.  No CVA tenderness.  Musculoskeletal:        General: Normal range of motion.     Cervical back: Full passive range of motion without pain and normal range of motion.     Comments: Moves all 4 extremities without difficulty.  Lower extremities without edema, erythema or warmth.  Lymphadenopathy:     Cervical: No cervical adenopathy.  Skin:    General: Skin is warm and dry.     Comments: Brisk capillary refill.  No rashes or lesions.  Neurological:     Mental Status: She is alert.     Comments: Mental Status:  Alert, oriented, thought content appropriate. Speech fluent without evidence of aphasia. Able to follow 2 step commands without difficulty.  Cranial Nerves:  II:  Peripheral visual fields grossly normal, pupils equal, round, reactive to  light III,IV, VI: ptosis not present, extra-ocular motions intact bilaterally  V,VII: smile symmetric, facial light touch sensation equal VIII: hearing grossly normal bilaterally  IX,X: midline uvula rise  XI: bilateral shoulder shrug equal and strong XII: midline tongue extension  Motor:  5/5 in  upper and lower extremities bilaterally including strong and equal grip strength and dorsiflexion/plantar flexion Sensory: Pinprick and light touch normal in all extremities.  Deep Tendon Reflexes: 2+ and symmetric  Cerebellar: normal finger-to-nose with bilateral upper extremities Gait: normal gait and balance CV: distal pulses palpable throughout      ED Results / Procedures / Treatments   Labs (all labs ordered are listed, but only abnormal results are displayed) Labs Reviewed  CBC WITH DIFFERENTIAL/PLATELET - Abnormal; Notable for the following components:      Result Value   Lymphs Abs 0.5 (*)    All other components within normal limits  COMPREHENSIVE METABOLIC PANEL - Abnormal; Notable for the following components:   Sodium 134 (*)    CO2 21 (*)    Glucose, Bld 104 (*)    Creatinine, Ser 1.05 (*)    Calcium 8.3 (*)    AST 87 (*)    ALT 123 (*)    Total Bilirubin 0.2 (*)    All other components within normal limits  TRIGLYCERIDES - Abnormal; Notable for the following components:   Triglycerides 156 (*)    All other components within normal limits  FIBRINOGEN - Abnormal; Notable for the following components:   Fibrinogen 512 (*)    All other components within normal limits  C-REACTIVE PROTEIN - Abnormal; Notable for the following components:   CRP 12.7 (*)    All other components within normal limits  URINALYSIS, ROUTINE W REFLEX MICROSCOPIC - Abnormal; Notable for the following components:   APPearance HAZY (*)    Specific Gravity, Urine >1.030 (*)    Protein, ur 30 (*)    All other components within normal limits  URINALYSIS, MICROSCOPIC (REFLEX) - Abnormal; Notable for  the following components:   Bacteria, UA MANY (*)    All other components within normal limits  CULTURE, BLOOD (ROUTINE X 2)  CULTURE, BLOOD (ROUTINE X 2)  URINE CULTURE  LACTIC ACID, PLASMA  D-DIMER, QUANTITATIVE (NOT AT [facility])  PROCALCITONIN  LACTATE DEHYDROGENASE  FERRITIN  ACETAMINOPHEN LEVEL  HCG, QUANTITATIVE, PREGNANCY   EKG EKG Interpretation  Date/Time:  Friday March 09 2020 13:23:50 EDT Ventricular Rate:  102 PR Interval:    QRS Duration: 81 QT Interval:  385 QTC Calculation: 502 R Axis:   40 Text Interpretation: Sinus tachycardia Nonspecific T wave abnormality Borderline prolonged QT interval No previous tracing Confirmed by Lajean Saver [phone]) on 03/09/2020 1:26:40 PM   Radiology DG Chest Port 1 View  Result Date: 03/09/2020 CLINICAL DATA:  Shortness of breath EXAM: PORTABLE CHEST 1 VIEW COMPARISON:  May 13, 2017 chest radiograph and chest CT November 01, 2018 FINDINGS: No edema or airspace opacity. There is slight atelectasis in the left mid lung. Mild prominence of the epicardial fat along the left heart border noted. The heart size and pulmonary vascularity are normal. No adenopathy. There is postoperative change in the lower cervical region. IMPRESSION: Slight left midlung atelectasis. No edema or airspace opacity. Cardiac silhouette within normal limits. Electronically Signed   By: Lowella Grip III M.D.   On: 03/09/2020 13:22    Procedures Procedures (including critical care time)  Medications Ordered in ED Medications  0.9 %  sodium chloride infusion (1,000 mLs Intravenous New Bag/Given 03/09/20 1722)  sodium chloride 0.9 % bolus 1,000 mL (0 mLs Intravenous Stopped 03/09/20 1506)  ondansetron (ZOFRAN) injection 4 mg (4 mg Intravenous Given 03/09/20 1342)  morphine 4 MG/ML injection 4 mg (4 mg Intravenous Given 03/09/20 1340)  ketorolac (  TORADOL) 30 MG/ML injection 30 mg (30 mg Intravenous Given 03/09/20 1503)  acetaminophen (TYLENOL) tablet 1,000 mg (1,000 mg  Oral Given 03/09/20 1503)  lactated ringers bolus 1,000 mL (0 mLs Intravenous Stopped 03/09/20 1714)  prochlorperazine (COMPAZINE) injection 10 mg (10 mg Intravenous Given 03/09/20 1730)  diphenhydrAMINE (BENADRYL) injection 25 mg (25 mg Intravenous Given 03/09/20 1726)  dexamethasone (DECADRON) injection 10 mg (10 mg Intravenous Given 03/09/20 1723)  HYDROmorphone (DILAUDID) injection 1 mg (1 mg Intravenous Given 03/09/20 1733)   ED Course  I have reviewed the triage vital signs and the nursing notes.  Pertinent labs & imaging results that were available during my care of the patient were reviewed by me and considered in my medical decision making (see chart for details).  49 year old presents for evaluation of fever, known Covid positive.  Tested on Monday in urgent care.  Given ivermectin, Norco, Tylenol, Decadron.  Patient states she has been taking Tylenol which has been controlling her fevers at home.  States she tries to take it on a regular basis however does miss some doses.  Has not been taking ibuprofen.  Patient with headache as well.  Denies sudden onset thunderclap headache.  She has a nonfocal neuro exam without deficits.  States it feels like a "diffuse pressure."  She has no neck stiffness or neck rigidity.  She has no chest pain or shortness of breath.  Does have a nonproductive cough.  No unilateral leg swelling, redness or warmth.  Clinically no evidence of DVT on exam.  Did have diarrhea yesterday and NBNB emesis.  No associated nominal pain.  She has no rebound or guarding on exam.  No focal abdominal pain.  She is passing flatulence.  Ambulatory here in the ED without any hypoxia, oxygen saturation 95-100% on room air.  Heart rate 108-111 with ambulation.  She does appear to be mildly weak on exam and fatigue.  Plan on labs, imaging and reassess  Labs and imaging personally reviewed and interpreted:  CBC without leukocytosis Metabolic panel with mild hyponatremia to 134, CO2 21,  hyperglycemia to 104, creatinine 1.05, hypocalcemia to 8.3, mild elevation AST and ALT however no elevation in alk phos, T bili.  Negative Murphy sign.  Low suspicion for acute cholecystitis, cholangitis. Plain film x-ray with some mild atelectasis however no cardiomegaly, pleural edema, pneumothorax, infiltrates Lactic acid 1.3 Acetaminophen level 16 Ddimer 0.41 Preg quant 4 UA many bacteria, squams 21-50, will sent for culture. No UA complaints hold on abx at this time.  Patient reassessed. Fever downtrending after Tylenol to 98.9. Tolerating PO intake without difficulty.  Patient reassessed.  Headache significantly improved with migraine cocktail.  She did have 1 reported blood pressure of 99/84 however nursing readjust the cuff and repeat blood pressure 114/71.  Has had some intermittent tachypnea with ambulation to the low 20s however does not appear in acute respiratory distress.  Presentation is like pts typical HA and non concerning for dissection, mass, SAH, ICH, Meningitis, or temporal arteritis.  Patient appears otherwise well.  Stable vital signs, defervesced with Tylenol.  On reassessment she has a nonfocal neuro exam without deficits.  Likely body aches and pains, cough,  HA, earlier emesis which has resolved likely due to Covid infection.  Has had multiple repeat abdominal exams neuro exam is without acute findings.  Will DC home with symptomatic management.  Did request Norco for her headache which was given to her by urgent care on Monday.  I discussed with patient  that we do not recommend narcotics for headaches as they are more prone to rebound headaches.  Discussed alternating Tylenol, ibuprofen and Reglan at home.  The patient has been appropriately medically screened and/or stabilized in the ED. I have low suspicion for any other emergent medical condition which would require further screening, evaluation or treatment in the ED or require inpatient management.  Patient is  hemodynamically stable and in no acute distress.  Patient able to ambulate in department prior to ED.  Evaluation does not show acute pathology that would require ongoing or additional emergent interventions while in the emergency department or further inpatient treatment.  I have discussed the diagnosis with the patient and answered all questions.  Pain is been managed while in the emergency department and patient has no further complaints prior to discharge.  Patient is comfortable with plan discussed in room and is stable for discharge at this time.  I have discussed strict return precautions for returning to the emergency department.  Patient was encouraged to follow-up with PCP/specialist refer to at discharge.     MDM Rules/Calculators/A&P                          Olivia [name] was evaluated in Emergency Department on 03/09/2020 for the symptoms described in the history of present illness. She was evaluated in the context of the global COVID-19 pandemic, which necessitated consideration that the patient might be at risk for infection with the SARS-CoV-2 virus that causes COVID-19. Institutional protocols and algorithms that pertain to the evaluation of patients at risk for COVID-19 are in a state of rapid change based on information released by regulatory bodies including the CDC and federal and state organizations. These policies and algorithms were followed during the patient's care in the ED. Final Clinical Impression(s) / ED Diagnoses Final diagnoses:  COVID-19  Acute nonintractable headache, unspecified headache type  Non-intractable vomiting with nausea, unspecified vomiting type  Cough  Myalgia    Rx / DC Orders ED Discharge Orders         Ordered    metoCLOPramide (REGLAN) 10 MG tablet  Every 6 hours     Discontinue  Reprint     03/09/20 1812    diphenhydrAMINE (BENADRYL) 25 MG tablet  Every 6 hours PRN     Discontinue  Reprint     03/09/20 1812    benzonatate (TESSALON) 100 MG  capsule  Every 8 hours     Discontinue  Reprint     03/09/20 1812    dexamethasone (DECADRON) 6 MG tablet  Daily     Discontinue  Reprint     03/09/20 1812    Cetirizine HCl (ZYRTEC ALLERGY) 10 MG CAPS     Discontinue  Reprint     03/09/20 1812    fluticasone (FLONASE) 50 MCG/ACT nasal spray  Daily     Discontinue  Reprint     03/09/20 1812           Yoshito Gaza A, PA-C 03/09/20 1816    Lajean Saver, MD 03/12/20 1433

## 2020-03-09 NOTE — ED Notes (Signed)
PT given bedside commode for UA, PT given Education on how to Obtain. Told to press call bell when collected.

## 2020-03-11 LAB — URINE CULTURE: Culture: 60000 — AB

## 2020-03-13 ENCOUNTER — Telehealth: Payer: Self-pay

## 2020-03-13 NOTE — Telephone Encounter (Signed)
No treatment for UC ED 03/09/20 per Rhea Bleacher PA

## 2020-03-14 LAB — CULTURE, BLOOD (ROUTINE X 2)
Culture: NO GROWTH
Culture: NO GROWTH
Special Requests: ADEQUATE
Special Requests: ADEQUATE

## 2021-10-18 IMAGING — DX DG CHEST 1V PORT
1 series · 1 of 1 positions shown · non-contrast
Comparison: May 13, 2017 chest radiograph and chest CT November 01, 2018

CLINICAL DATA: Shortness of breath

EXAM:
PORTABLE CHEST 1 VIEW

[chest ap]
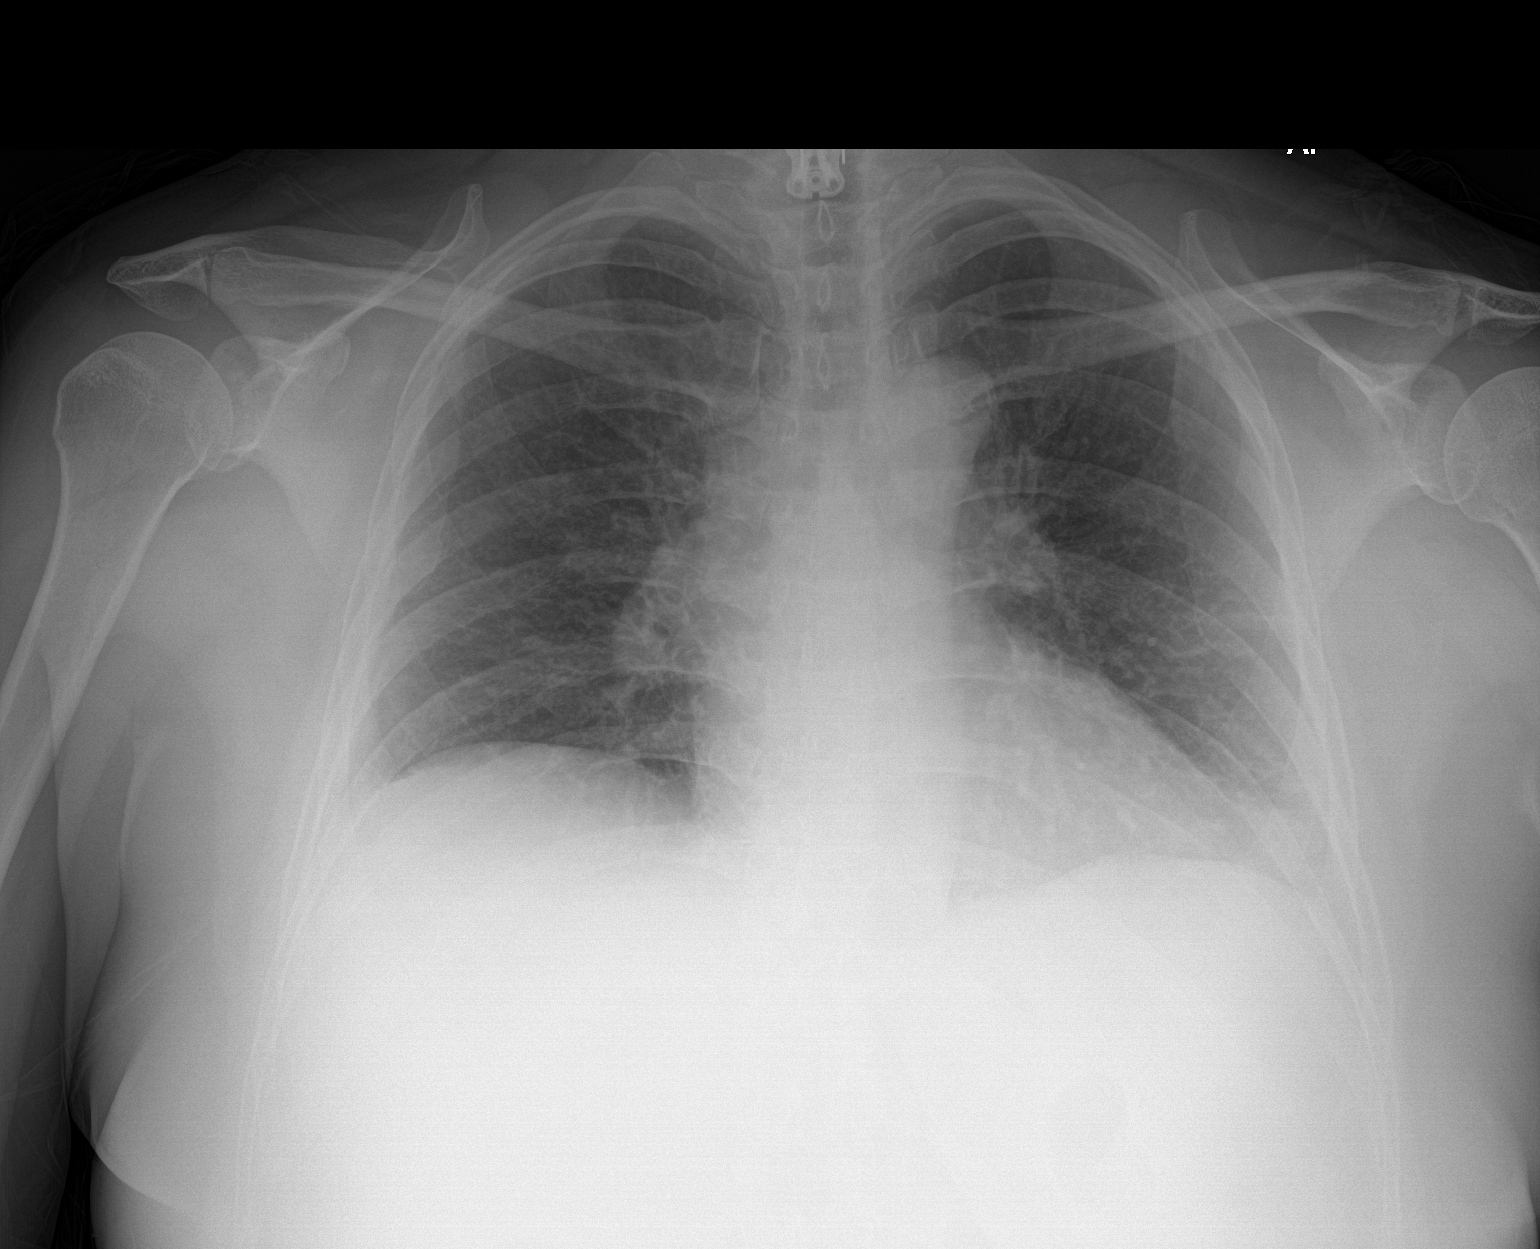

[1 of 1 positions shown; findings below may reference images not displayed]

FINDINGS: No edema or airspace opacity. There is slight atelectasis in the
left mid lung. Mild prominence of the epicardial fat along the left
heart border noted. The heart size and pulmonary vascularity are
normal. No adenopathy. There is postoperative change in the lower
cervical region.
IMPRESSION: Slight left midlung atelectasis. No edema or airspace opacity.
Cardiac silhouette within normal limits.

## 2023-04-01 ENCOUNTER — Ambulatory Visit (INDEPENDENT_AMBULATORY_CARE_PROVIDER_SITE_OTHER): Payer: BC Managed Care – PPO

## 2023-04-01 ENCOUNTER — Encounter: Payer: Self-pay | Admitting: Podiatry

## 2023-04-01 ENCOUNTER — Ambulatory Visit (INDEPENDENT_AMBULATORY_CARE_PROVIDER_SITE_OTHER): Payer: BLUE CROSS/BLUE SHIELD | Admitting: Podiatry

## 2023-04-01 DIAGNOSIS — M84375A Stress fracture, left foot, initial encounter for fracture: Secondary | ICD-10-CM | POA: Diagnosis not present

## 2023-04-01 DIAGNOSIS — M79672 Pain in left foot: Secondary | ICD-10-CM

## 2023-04-01 NOTE — Progress Notes (Signed)
  Subjective:  Patient ID: Olivia [name], female    DOB: 04-Feb-1971,   MRN: [id number]  No chief complaint on file.   52 y.o. female presents for concern of pain to the top of the left foot that has been going on for several months. 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 She has also moved recently and been on her feet. Relates pain along the top of her foot. Relates pain mostly with any touching in the area but also painful when walking. Relates a history of plantar fasciitis and has had injections and orthotics for this. No current treatments  . Denies any other pedal complaints. Denies n/v/f/c.   Past Medical History:  Diagnosis Date   Asthma    Depression    Hypertension     Objective:  Physical Exam: Vascular: DP/PT pulses 2/4 bilateral. CFT <3 seconds. Normal hair growth on digits. No edema.  Skin. No lacerations or abrasions bilateral feet.  Musculoskeletal: MMT 5/5 bilateral lower extremities in DF, PF, Inversion and Eversion. Deceased ROM in DF of ankle joint. Tender mostly over the second metatarsal dorsally. No pain along first fourth or fifth. Some pain along third. Mild edema noted in the area. No pain with ROM of the MPJs and no pain plantar ly.  Neurological: Sensation intact to light touch.   Assessment:   1. Stress reaction of left foot, initial encounter      Plan:  Patient was evaluated and treated and all questions answered. -Xrays reviewed. No acute fractures but mild periosteal reaction noted around the second metatarsal diaphysis  -Discussed treatement options for stress reaction; risks, alternatives, and benefits explained. -Dispensed CAM boot. Patient to wear at all times and instructed on use -Recommend protection, rest, ice, elevation daily until symptoms improve -Anti-inflammatories as needed -Patient to return to office in 4 weeks for serial x-rays to assess healing  or sooner if condition worsens.   Louann Sjogren, DPM

## 2023-04-29 ENCOUNTER — Ambulatory Visit: Payer: BC Managed Care – PPO | Admitting: Podiatry

## 2023-05-04 ENCOUNTER — Ambulatory Visit (INDEPENDENT_AMBULATORY_CARE_PROVIDER_SITE_OTHER): Payer: BC Managed Care – PPO

## 2023-05-04 ENCOUNTER — Ambulatory Visit: Payer: BC Managed Care – PPO | Admitting: Podiatry

## 2023-05-04 ENCOUNTER — Encounter: Payer: Self-pay | Admitting: Podiatry

## 2023-05-04 DIAGNOSIS — M84375D Stress fracture, left foot, subsequent encounter for fracture with routine healing: Secondary | ICD-10-CM | POA: Diagnosis not present

## 2023-05-04 NOTE — Progress Notes (Signed)
  Subjective:  Patient ID: Olivia [name], female    DOB: 02/26/1971,   MRN: [id number]  No chief complaint on file.   52 y.o. female presents for follow-up of left foot pain. Has been in the CAM boot. Relates she feels better in the boot. Has tried to transition to regular shoes and has gotten some pain. . Relates a history of plantar fasciitis and has had injections and orthotics for this. No current treatments  . Denies any other pedal complaints. Denies n/v/f/c.   Past Medical History:  Diagnosis Date   Asthma    Depression    Hypertension     Objective:  Physical Exam: Vascular: DP/PT pulses 2/4 bilateral. CFT <3 seconds. Normal hair growth on digits. No edema.  Skin. No lacerations or abrasions bilateral feet.  Musculoskeletal: MMT 5/5 bilateral lower extremities in DF, PF, Inversion and Eversion. Deceased ROM in DF of ankle joint. Mildly tender mostly over the second metatarsal dorsally. No pain along first fourth or fifth. Some pain along third. Mild edema noted in the area. No pain with ROM of the MPJs and no pain plantar ly.  Neurological: Sensation intact to light touch.   Assessment:   1. Stress reaction of left foot with routine healing, subsequent encounter       Plan:  Patient was evaluated and treated and all questions answered. -Xrays reviewed. No acute fractures but mild periosteal reaction noted around the second metatarsal diaphysis possible with signs of healing.  -Discussed treatement options for stress reaction; risks, alternatives, and benefits explained. -Continue CAM boot for one more week. Then try transitioning into regular shoes slowly.  -Recommend protection, rest, ice, elevation daily until symptoms improve -Anti-inflammatories as needed -Patient to return to office as needed.    Louann Sjogren, DPM

## 2023-11-10 ENCOUNTER — Emergency Department (HOSPITAL_COMMUNITY)

## 2023-11-10 ENCOUNTER — Other Ambulatory Visit: Payer: Self-pay

## 2023-11-10 ENCOUNTER — Emergency Department (HOSPITAL_COMMUNITY)
Admission: EM | Admit: 2023-11-10 | Discharge: 2023-11-10 | Disposition: A | Discharge status: Home / Self Care | Attending: Emergency Medicine | Admitting: Emergency Medicine

## 2023-11-10 DIAGNOSIS — R0789 Other chest pain: Secondary | ICD-10-CM | POA: Diagnosis not present

## 2023-11-10 DIAGNOSIS — R519 Headache, unspecified: Secondary | ICD-10-CM

## 2023-11-10 DIAGNOSIS — R200 Anesthesia of skin: Secondary | ICD-10-CM | POA: Diagnosis not present

## 2023-11-10 DIAGNOSIS — R079 Chest pain, unspecified: Secondary | ICD-10-CM

## 2023-11-10 DIAGNOSIS — R202 Paresthesia of skin: Secondary | ICD-10-CM | POA: Diagnosis not present

## 2023-11-10 DIAGNOSIS — H53149 Visual discomfort, unspecified: Secondary | ICD-10-CM

## 2023-11-10 DIAGNOSIS — G43809 Other migraine, not intractable, without status migrainosus: Secondary | ICD-10-CM | POA: Insufficient documentation

## 2023-11-10 DIAGNOSIS — J45909 Unspecified asthma, uncomplicated: Secondary | ICD-10-CM | POA: Diagnosis not present

## 2023-11-10 DIAGNOSIS — R61 Generalized hyperhidrosis: Secondary | ICD-10-CM

## 2023-11-10 DIAGNOSIS — R42 Dizziness and giddiness: Secondary | ICD-10-CM | POA: Insufficient documentation

## 2023-11-10 DIAGNOSIS — R55 Syncope and collapse: Secondary | ICD-10-CM | POA: Diagnosis not present

## 2023-11-10 DIAGNOSIS — R531 Weakness: Secondary | ICD-10-CM

## 2023-11-10 LAB — ETHANOL: Alcohol, Ethyl (B): <10 mg/dL (ref <10)

## 2023-11-10 LAB — DIFFERENTIAL
Abs Immature Granulocytes: 0.04 10*3/uL (ref 0.00–0.07)
Basophils Absolute: 0.1 10*3/uL (ref 0.0–0.1)
Basophils Relative: 1 %
Eosinophils Absolute: 0 10*3/uL (ref 0.0–0.5)
Eosinophils Relative: 0 %
Immature Granulocytes: 0 %
Lymphocytes Relative: 16 %
Lymphs Abs: 1.7 10*3/uL (ref 0.7–4.0)
Monocytes Absolute: 0.7 10*3/uL (ref 0.1–1.0)
Monocytes Relative: 7 %
Neutro Abs: 7.8 10*3/uL — ABNORMAL HIGH (ref 1.7–7.7)
Neutrophils Relative %: 76 %

## 2023-11-10 LAB — TROPONIN I (HIGH SENSITIVITY)
Troponin I (High Sensitivity): 4 ng/L (ref <18)
Troponin I (High Sensitivity): 6 ng/L (ref <18)

## 2023-11-10 LAB — CBC
HCT: 46.1 % — ABNORMAL HIGH (ref 36.0–46.0)
Hemoglobin: 15.6 g/dL — ABNORMAL HIGH (ref 12.0–15.0)
MCH: 31.5 pg (ref 26.0–34.0)
MCHC: 33.8 g/dL (ref 30.0–36.0)
MCV: 92.9 fL (ref 80.0–100.0)
Platelets: 270 10*3/uL (ref 150–400)
RBC: 4.96 MIL/uL (ref 3.87–5.11)
RDW: 12.5 % (ref 11.5–15.5)
WBC: 10.3 10*3/uL (ref 4.0–10.5)
nRBC: 0 % (ref 0.0–0.2)

## 2023-11-10 LAB — I-STAT CHEM 8, ED
BUN: 27 mg/dL — ABNORMAL HIGH (ref 6–20)
Calcium, Ion: 1.13 mmol/L — ABNORMAL LOW (ref 1.15–1.40)
Chloride: 106 mmol/L (ref 98–111)
Creatinine, Ser: 1.3 mg/dL — ABNORMAL HIGH (ref 0.44–1.00)
Glucose, Bld: 98 mg/dL (ref 70–99)
HCT: 47 % — ABNORMAL HIGH (ref 36.0–46.0)
Hemoglobin: 16 g/dL — ABNORMAL HIGH (ref 12.0–15.0)
Potassium: 5.9 mmol/L — ABNORMAL HIGH (ref 3.5–5.1)
Sodium: 136 mmol/L (ref 135–145)
TCO2: 25 mmol/L (ref 22–32)

## 2023-11-10 LAB — COMPREHENSIVE METABOLIC PANEL WITH GFR
ALT: 31 U/L (ref 0–44)
AST: 42 U/L — ABNORMAL HIGH (ref 15–41)
Albumin: 4.2 g/dL (ref 3.5–5.0)
Alkaline Phosphatase: 91 U/L (ref 38–126)
Anion gap: 15 (ref 5–15)
BUN: 17 mg/dL (ref 6–20)
CO2: 16 mmol/L — ABNORMAL LOW (ref 22–32)
Calcium: 9.6 mg/dL (ref 8.9–10.3)
Chloride: 103 mmol/L (ref 98–111)
Creatinine, Ser: 1.16 mg/dL — ABNORMAL HIGH (ref 0.44–1.00)
GFR, Estimated: 56 mL/min — ABNORMAL LOW (ref >60)
Glucose, Bld: 99 mg/dL (ref 70–99)
Potassium: 5.6 mmol/L — ABNORMAL HIGH (ref 3.5–5.1)
Sodium: 134 mmol/L — ABNORMAL LOW (ref 135–145)
Total Bilirubin: 0.6 mg/dL (ref 0.0–1.2)
Total Protein: 6.9 g/dL (ref 6.5–8.1)

## 2023-11-10 LAB — PROTIME-INR
INR: 1 (ref 0.8–1.2)
Prothrombin Time: 13.3 s (ref 11.4–15.2)

## 2023-11-10 LAB — APTT: aPTT: 30 s (ref 24–36)

## 2023-11-10 LAB — CBG MONITORING, ED: Glucose-Capillary: 101 mg/dL — ABNORMAL HIGH (ref 70–99)

## 2023-11-10 MED ORDER — LORAZEPAM 2 MG/ML IJ SOLN
INTRAMUSCULAR | Status: AC
  Filled 2023-11-10: qty 1

## 2023-11-10 MED ORDER — SODIUM CHLORIDE 0.9 % IV BOLUS
1000.0000 mL | Freq: Once | INTRAVENOUS | Status: AC
  Administered 2023-11-10: 1000 mL via INTRAVENOUS

## 2023-11-10 MED ORDER — LORAZEPAM 2 MG/ML IJ SOLN
1.0000 mg | Freq: Once | INTRAMUSCULAR | Status: AC
  Administered 2023-11-10: 1 mg via INTRAVENOUS

## 2023-11-10 MED ORDER — METOCLOPRAMIDE HCL 5 MG/ML IJ SOLN
10.0000 mg | Freq: Once | INTRAMUSCULAR | Status: AC
  Administered 2023-11-10: 10 mg via INTRAVENOUS
  Filled 2023-11-10: qty 2

## 2023-11-10 MED ORDER — SODIUM CHLORIDE 0.9% FLUSH
3.0000 mL | Freq: Once | INTRAVENOUS | Status: AC
  Administered 2023-11-10: 3 mL via INTRAVENOUS

## 2023-11-10 MED ORDER — SUMATRIPTAN SUCCINATE 50 MG PO TABS
50.0000 mg | ORAL_TABLET | Freq: Once | ORAL | Status: AC
  Administered 2023-11-10: 50 mg via ORAL
  Filled 2023-11-10: qty 1

## 2023-11-10 MED ORDER — DIPHENHYDRAMINE HCL 50 MG/ML IJ SOLN
50.0000 mg | Freq: Once | INTRAMUSCULAR | Status: AC
  Administered 2023-11-10: 50 mg via INTRAVENOUS
  Filled 2023-11-10: qty 1

## 2023-11-10 MED ORDER — IOHEXOL 350 MG/ML SOLN
75.0000 mL | Freq: Once | INTRAVENOUS | Status: AC | PRN
  Administered 2023-11-10: 75 mL via INTRAVENOUS

## 2023-11-10 NOTE — ED Triage Notes (Signed)
 Patient arrivea via [facility] from Urgent Care. EMS called for chest pain and two syncopal events at [facility]. Patient also endorses diarrhea x few days. Upon EMS arrival. Patient endorses lightheadedness, right arm tingling, and right leg feeling heavy but weak. LKW 1300. Alert and oriented on arrival. 20 R AC.   VS 116/74 HR 104 RR 18 O2 98 on room air CBG 104

## 2023-11-10 NOTE — Code Documentation (Addendum)
 Stroke Response Nurse Documentation Code Documentation  Olivia [name] is a 53 y.o. female arriving to [facility]  via [facility] on 11/10/2023 with past medical hx of HTN PTSD Hypothyroidism. On No antithrombotic. Code stroke was activated by EMS.   Patient from work where she was LKW at 1300 and now complaining of Rt sided weakness and sensory loss. She also states that she passed out a few times today.  Stroke team at the bedside on patient arrival. Labs drawn and patient cleared for CT by Dr. Silverio Lay. Patient to CT with team. NIHSS 9, see documentation for details and code stroke times. Patient with disoriented, right arm weakness, bilateral leg weakness, right decreased sensation, and Expressive aphasia  on exam. The following imaging was completed:  CT Head, CTA, and MRI. Patient is not a candidate for IV Thrombolytic due to stroke not suspected. Patient is not a candidate for IR due to LVO negative.     Bedside handoff with ED RN Dahlia Client complete  Argentina Donovan  Stroke Response RN

## 2023-11-10 NOTE — ED Notes (Signed)
 Pt ambulated in up and down the hall way fine. O2 99-100  and pulse 100-103 the entire time when ambulating, and stated she was fine when walking. Pt steady, alert and now back in bed.

## 2023-11-10 NOTE — ED Notes (Signed)
Patient ambulated to bedside commode with minimal assistance.

## 2023-11-10 NOTE — ED Provider Notes (Signed)
 [place] EMERGENCY DEPARTMENT AT [facility] Provider Note   CSN: [id number] Arrival date & time: 11/10/23  1613  An emergency department physician performed an initial assessment on this suspected stroke patient at 27.  History  Chief Complaint  Patient presents with   Code Stroke    Olivia [name] is a 53 y.o. female history of kidney stones, ADHD, asthma, here presenting with chest pain and near syncope and right-sided weakness.  Patient states that around 1 PM, she had acute onset of right-sided numbness and weakness.  She states that her right side feels heavy.  She has been having severe headaches since yesterday.  Patient then had right-sided heavy chest pain.  Denies any sharp stabbing pain to the back or abdominal pain.  No history of stroke in the past.  Patient does have history of migraines and did take sumatriptan yesterday.  Patient went to urgent care and was sent here and code stroke was activated by EMS.  The history is provided by the patient.       Home Medications Prior to Admission medications   Medication Sig Start Date End Date Taking? Authorizing Provider  amphetamine-dextroamphetamine (ADDERALL) 20 MG tablet Take 1 tablet (20 mg total) by mouth daily. 10/26/19   Mozingo, Thereasa Solo, NP  amphetamine-dextroamphetamine (ADDERALL) 20 MG tablet Take 1 tablet (20 mg total) by mouth daily. 11/23/19   Mozingo, Thereasa Solo, NP  amphetamine-dextroamphetamine (ADDERALL) 20 MG tablet Take 1 tablet (20 mg total) by mouth daily. 12/21/19   Mozingo, Thereasa Solo, NP  benzonatate (TESSALON) 100 MG capsule Take 1 capsule (100 mg total) by mouth every 8 (eight) hours. 03/09/20   Henderly, Britni A, PA-C  buPROPion (WELLBUTRIN XL) 300 MG 24 hr tablet TK 1 T PO D 10/26/19   Mozingo, Thereasa Solo, NP  Cetirizine HCl (ZYRTEC ALLERGY) 10 MG CAPS Take once daily 03/09/20   Henderly, Britni A, PA-C  clonazePAM (KLONOPIN) 0.5 MG tablet TK 1 T PO TID PRN 01/04/20    Mozingo, Thereasa Solo, NP  diphenhydrAMINE (BENADRYL) 25 MG tablet Take 1 tablet (25 mg total) by mouth every 6 (six) hours as needed. 03/09/20   Henderly, Britni A, PA-C  escitalopram (LEXAPRO) 20 MG tablet take 1 tablet by mouth once daily 06/22/15   [provider]  escitalopram (LEXAPRO) 20 MG tablet Take 1 tablet (20 mg total) by mouth daily. 10/26/19   Mozingo, Thereasa Solo, NP  estradiol (ESTRACE) 1 MG tablet  04/25/19   [provider]  estradiol (VIVELLE-DOT) 0.025 MG/24HR  04/20/19   [provider]  fluticasone (FLONASE) 50 MCG/ACT nasal spray Place 2 sprays into both nostrils daily. 03/09/20   Henderly, Britni A, PA-C  metoCLOPramide (REGLAN) 10 MG tablet Take 1 tablet (10 mg total) by mouth every 6 (six) hours. 03/09/20   Henderly, Britni A, PA-C  NP THYROID 120 MG tablet TK 1 T PO QD OES 04/17/19   [provider]  NP THYROID 30 MG tablet Take 30 mg by mouth daily. 10/17/19   [provider]  progesterone (PROMETRIUM) 100 MG capsule  04/25/19   [provider]  QUEtiapine (SEROQUEL) 100 MG tablet Take two to three tablets at bedtime. 10/26/19   Mozingo, Thereasa Solo, NP  TRULANCE 3 MG TABS  02/26/19   [provider]      Allergies    Other, Sulfa antibiotics, Sulfasalazine, and Silicone    Review of Systems   Review of Systems  Cardiovascular:  Positive for  chest pain.  Neurological:  Positive for headaches.  All other systems reviewed and are negative.   Physical Exam Updated Vital Signs BP 120/80 Comment: in CT  Ht 5\' 2"  (1.575 m)   Wt 81 kg   BMI 32.66 kg/m  Physical Exam Vitals and nursing note reviewed.  Constitutional:      Comments: Slightly uncomfortable  HENT:     Head: Normocephalic.     Nose: Nose normal.     Mouth/Throat:     Mouth: Mucous membranes are moist.  Eyes:     Extraocular Movements: Extraocular movements intact.     Pupils: Pupils are equal, round, and reactive to light.   Cardiovascular:     Rate and Rhythm: Normal rate and regular rhythm.     Pulses: Normal pulses.     Heart sounds: Normal heart sounds.  Pulmonary:     Effort: Pulmonary effort is normal.     Breath sounds: Normal breath sounds.  Abdominal:     General: Abdomen is flat.     Palpations: Abdomen is soft.  Musculoskeletal:        General: Normal range of motion.     Cervical back: Normal range of motion and neck supple.  Skin:    General: Skin is warm.  Neurological:     Mental Status: She is alert.     Comments: No obvious facial droop.  No obvious visual field cut.  Patient does have subjective decrease sensation of the right arm compared to the left.  Patient does have 4 out of 5 strength on the right arm compared to the left.  Patient has normal strength of the bilateral lower extremities.  Psychiatric:        Mood and Affect: Mood normal.     ED Results / Procedures / Treatments   Labs (all labs ordered are listed, but only abnormal results are displayed) Labs Reviewed  CBC - Abnormal; Notable for the following components:      Result Value   Hemoglobin 15.6 (*)    HCT 46.1 (*)    All other components within normal limits  DIFFERENTIAL - Abnormal; Notable for the following components:   Neutro Abs 7.8 (*)    All other components within normal limits  I-STAT CHEM 8, ED - Abnormal; Notable for the following components:   Potassium 5.9 (*)    BUN 27 (*)    Creatinine, Ser 1.30 (*)    Calcium, Ion 1.13 (*)    Hemoglobin 16.0 (*)    HCT 47.0 (*)    All other components within normal limits  CBG MONITORING, ED - Abnormal; Notable for the following components:   Glucose-Capillary 101 (*)    All other components within normal limits  PROTIME-INR  APTT  COMPREHENSIVE METABOLIC PANEL WITH GFR  ETHANOL  TROPONIN I (HIGH SENSITIVITY)    EKG None  Radiology CT HEAD CODE STROKE WO CONTRAST Result Date: 11/10/2023 CLINICAL DATA:  Code stroke. Provided history: Neuro  deficit, acute, stroke suspected. EXAM: CT HEAD WITHOUT CONTRAST TECHNIQUE: Contiguous axial images were obtained from the base of the skull through the vertex without intravenous contrast. RADIATION DOSE REDUCTION: This exam was performed according to the departmental dose-optimization program which includes automated exposure control, adjustment of the mA and/or kV according to patient size and/or use of iterative reconstruction technique. COMPARISON:  Report from head CT 11/02/2018 (images unavailable). FINDINGS: Brain: Mild generalized cerebral atrophy. Mild patchy and ill-defined hypoattenuation within the cerebral [place] matter, nonspecific.  There is no acute intracranial hemorrhage. No demarcated cortical infarct. No extra-axial fluid collection. No evidence of an intracranial mass. No midline shift. Vascular: Calcified atherosclerotic plaque or calcified embolic plaque along the course of the anterior cerebral arteries at the A4 segment level (series 6, image 31) (series 2, image 22). Skull: No calvarial fracture or aggressive osseous lesion. Sinuses/Orbits: No mass or acute finding within the imaged orbits. No significant paranasal sinus disease at the imaged levels. ASPECTS [facility] Stroke Program Early CT Score) - Ganglionic level infarction (caudate, lentiform nuclei, internal capsule, insula, M1-M3 cortex): 7 - Supraganglionic infarction (M4-M6 cortex): 3 Total score (0-10 with 10 being normal): 10 No acute intracranial hemorrhage or acute demarcated cortical infarct. Calcified atherosclerotic plaque or calcified embolic plaque along the course of the anterior cerebral arteries at the A4 segment level. These results were communicated to Dr. Amada Jupiter at 4:39 pmon 4/8/2025by text page via the [facility] messaging system. IMPRESSION: 1. No acute intracranial hemorrhage or acute demarcated cortical infarct. 2. Calcified atherosclerotic plaque or calcified embolic plaque along the course of the A4 anterior  cerebral arteries. Correlate with the patient's deficit(s) and with findings on the pending CTA head/neck. 3. Mild, nonspecific cerebral Trachtenberg matter disease. Electronically Signed   By: Jackey Loge D.O.   On: 11/10/2023 16:40   CT ANGIO HEAD NECK W WO CM (CODE STROKE) Result Date: 11/10/2023 CLINICAL DATA:  53 year old female code stroke. Head CT reported separately today. EXAM: CT ANGIOGRAPHY HEAD AND NECK WITH AND WITHOUT CONTRAST TECHNIQUE: Multidetector CT imaging of the head and neck was performed using the standard protocol during bolus administration of intravenous contrast. Multiplanar CT image reconstructions and MIPs were obtained to evaluate the vascular anatomy. Carotid stenosis measurements (when applicable) are obtained utilizing NASCET criteria, using the distal internal carotid diameter as the denominator. RADIATION DOSE REDUCTION: This exam was performed according to the departmental dose-optimization program which includes automated exposure control, adjustment of the mA and/or kV according to patient size and/or use of iterative reconstruction technique. CONTRAST:  75mL OMNIPAQUE IOHEXOL 350 MG/ML SOLN COMPARISON:  None Available. FINDINGS: CTA NECK Skeleton: C5-C6 and C6-C7 ACDF with evidence of solid arthrodesis. Torus palatinus, normal variant. No acute osseous abnormality identified. Upper chest: Mild apical and peribronchial lung scarring, crowding of markings. Negative visible superior mediastinum. Other neck: Nonvascular neck soft tissue spaces are within normal limits. Aortic arch: Mild arch tortuosity. Three vessel arch without atherosclerosis. Right carotid system: Negative right CCA, right carotid bifurcation. Highly tortuous but otherwise negative right ICA to the skull base. Left carotid system: Similar tortuosity. No atherosclerosis or stenosis. Vertebral arteries: Proximal right subclavian arteries and vertebral artery origins are normal. Tortuous cervical vertebral arteries,  fairly codominant and patent to the skull base with no plaque or stenosis. CTA HEAD Posterior circulation: Distal vertebral arteries, vertebrobasilar junction, PICA origins, basilar artery, SCA and PCA origins are patent without stenosis. Fetal type left PCA origin. Small right posterior communicating artery also present. Bilateral PCA branches are within normal limits. Anterior circulation: Both ICA siphons are patent, normal. MCA and ACA origins are normal. Mildly dominant right A1. Anterior communicating artery is normal. ACA A2 segment also remains dominant. Bilateral ACA branches are within normal limits. MCA M1 segments and bifurcations are patent without stenosis. Bilateral MCA branches are within normal limits. Venous sinuses: Patent. Anatomic variants: Dominant left ACA.  Fetal type right PCA origin. Review of the MIP images confirms the above findings IMPRESSION: 1. Negative CTA Head and Neck; no large vessel  occlusion, atherosclerosis, or stenosis. 2. Cervical spine ACDF with appearance of solid arthrodesis. Electronically Signed   By: Odessa Fleming M.D.   On: 11/10/2023 16:40    Procedures Procedures    Medications Ordered in ED Medications  sodium chloride flush (NS) 0.9 % injection 3 mL (has no administration in time range)  sodium chloride 0.9 % bolus 1,000 mL (has no administration in time range)  metoCLOPramide (REGLAN) injection 10 mg (has no administration in time range)  diphenhydrAMINE (BENADRYL) injection 50 mg (has no administration in time range)  iohexol (OMNIPAQUE) 350 MG/ML injection 75 mL (75 mLs Intravenous Contrast Given 11/10/23 1627)  LORazepam (ATIVAN) injection 1 mg (1 mg Intravenous Given 11/10/23 1639)    ED Course/ Medical Decision Making/ A&P                                 Medical Decision Making Tanessa Tidd is a 53 y.o. female here presenting with right arm weakness and numbness and headache and chest pain.  Consider complex migraine versus stroke.  Patient came in  as a code stroke and Dr. Amada Jupiter is at bedside.  Plan to get CTA and MRI and give migraine cocktail.  Patient also had chest pains will get troponin x 2.  Have low suspicion for dissection or PE.  8:51 PM I reviewed patient's labs.  Patient's potassium is 5.6 but was hemolyzed.  Creatinine is normal.  Troponin negative x 2.  MRI did not show any stroke.  There was a questionable artifact on the anterior chamber but I do not see any foreign body and patient does not have any foreign body sensation in the right globe.  Patient is feeling better after migraine cocktail and fluids.  At this point patient is stable for discharge.   Problems Addressed: Arm numbness: acute illness or injury Chest pain, unspecified type: acute illness or injury Dizziness: acute illness or injury Other migraine without status migrainosus, not intractable: acute illness or injury  Amount and/or Complexity of Data Reviewed Labs: ordered. Decision-making details documented in ED Course. Radiology: ordered and independent interpretation performed. Decision-making details documented in ED Course. ECG/medicine tests: ordered and independent interpretation performed. Decision-making details documented in ED Course.  Risk Prescription drug management.   Final Clinical Impression(s) / ED Diagnoses Final diagnoses:  None    Rx / DC Orders ED Discharge Orders     None         Charlynne Pander, MD 11/10/23 2053

## 2023-11-10 NOTE — Consult Note (Signed)
 NEUROLOGY CONSULT NOTE   Date of service: November 10, 2023 Patient Name: Olivia [name] MRN:  [id number] DOB:  11/11/1970 Chief Complaint: "right sided tingling" Requesting Provider: Charlynne Pander, MD  History of Present Illness  Olivia [name] is a 53 y.o. female with hx of migraines who presents with an episode of right-sided tingling as well as an episode of passing out earlier today.  She states that she first noticed that she was dropping things, and noticed that her wrist was tingling.  The tingling then spread up her arm and then finally started involving her leg.  She had a severe headache yesterday, but took sumatriptan and went to bed and woke up without a headache.  She does not currently have a severe headache, but does have photophobia.  The symptoms started around 1 PM, but due to the fact that they were getting worse, she sought care in the emergency department where code stroke was activated.  LKW: 1 PM Modified rankin score: 0-Completely asymptomatic and back to baseline post- stroke IV Thrombolysis: No, not a stroke EVT: No, not a stroke  NIHSS components Score: Comment  1a Level of Conscious 0[x]  1[]  2[]  3[]      1b LOC Questions 0[]  1[x]  2[]       1c LOC Commands 0[x]  1[]  2[]       2 Best Gaze 0[x]  1[]  2[]       3 Visual 0[x]  1[]  2[]  3[]      4 Facial Palsy 0[x]  1[]  2[]  3[]      5a Motor Arm - left 0[]  1[]  2[x]  3[]  4[]  UN[]    5b Motor Arm - Right 0[]  1[]  2[x]  3[]  4[]  UN[]    6a Motor Leg - Left 0[]  1[]  2[x]  3[]  4[]  UN[]    6b Motor Leg - Right 0[]  1[]  2[x]  3[]  4[]  UN[]    7 Limb Ataxia 0[x]  1[]  2[]  3[]  UN[]     8 Sensory 0[]  1[x]  2[]  UN[]      9 Best Language 0[x]  1[]  2[]  3[]      10 Dysarthria 0[x]  1[]  2[]  UN[]      11 Extinct. and Inattention 0[x]  1[]  2[]       TOTAL: 10      Past History   Past Medical History:  Diagnosis Date   Asthma    Depression    Hypertension     No past surgical history on file.  Family History: No family history on file.  Social History   reports that she has never smoked. She has never used smokeless tobacco. She reports that she does not drink alcohol and does not use drugs.  Allergies  Allergen Reactions   Other Rash   Sulfa Antibiotics Rash    All over body All over body   Sulfasalazine Rash   Silicone Rash    Silicone strips per pt    Medications   Current Facility-Administered Medications:    diphenhydrAMINE (BENADRYL) injection 50 mg, 50 mg, Intravenous, Once, Charlynne Pander, MD   metoCLOPramide [facility]) injection 10 mg, 10 mg, Intravenous, Once, Charlynne Pander, MD   sodium chloride 0.9 % bolus 1,000 mL, 1,000 mL, Intravenous, Once, Charlynne Pander, MD   sodium chloride flush (NS) 0.9 % injection 3 mL, 3 mL, Intravenous, Once, Charlynne Pander, MD  Current Outpatient Medications:    amphetamine-dextroamphetamine (ADDERALL) 20 MG tablet, Take 1 tablet (20 mg total) by mouth daily., Disp: 30 tablet, Rfl: 0   amphetamine-dextroamphetamine (ADDERALL) 20 MG tablet, Take 1 tablet (20 mg total) by mouth daily., Disp:  30 tablet, Rfl: 0   amphetamine-dextroamphetamine (ADDERALL) 20 MG tablet, Take 1 tablet (20 mg total) by mouth daily., Disp: 30 tablet, Rfl: 0   benzonatate (TESSALON) 100 MG capsule, Take 1 capsule (100 mg total) by mouth every 8 (eight) hours., Disp: 21 capsule, Rfl: 0   buPROPion (WELLBUTRIN XL) 300 MG 24 hr tablet, TK 1 T PO D, Disp: 30 tablet, Rfl: 5   Cetirizine HCl (ZYRTEC ALLERGY) 10 MG CAPS, Take once daily, Disp: 30 capsule, Rfl: 0   clonazePAM (KLONOPIN) 0.5 MG tablet, TK 1 T PO TID PRN, Disp: 90 tablet, Rfl: 2   diphenhydrAMINE (BENADRYL) 25 MG tablet, Take 1 tablet (25 mg total) by mouth every 6 (six) hours as needed., Disp: 30 tablet, Rfl: 0   escitalopram (LEXAPRO) 20 MG tablet, take 1 tablet by mouth once daily, Disp: , Rfl:    escitalopram (LEXAPRO) 20 MG tablet, Take 1 tablet (20 mg total) by mouth daily., Disp: 30 tablet, Rfl: 5   estradiol (ESTRACE) 1 MG tablet, , Disp: ,  Rfl:    estradiol (VIVELLE-DOT) 0.025 MG/24HR, , Disp: , Rfl:    fluticasone (FLONASE) 50 MCG/ACT nasal spray, Place 2 sprays into both nostrils daily., Disp: 11.1 mL, Rfl: 0   metoCLOPramide (REGLAN) 10 MG tablet, Take 1 tablet (10 mg total) by mouth every 6 (six) hours., Disp: 30 tablet, Rfl: 0   NP THYROID 120 MG tablet, TK 1 T PO QD OES, Disp: , Rfl:    NP THYROID 30 MG tablet, Take 30 mg by mouth daily., Disp: , Rfl:    progesterone (PROMETRIUM) 100 MG capsule, , Disp: , Rfl:    QUEtiapine (SEROQUEL) 100 MG tablet, Take two to three tablets at bedtime., Disp: 90 tablet, Rfl: 5   TRULANCE 3 MG TABS, , Disp: , Rfl:   Vitals   Vitals:   Dec 05, 2023 1600  [name]: 81 kg    Body mass index is 32.66 kg/m.  Physical Exam   Constitutional: Appears well-developed and well-nourished.   Neurologic Examination    Neuro: Mental Status: Patient is awake, alert, oriented to person, place, month, year, and situation. Patient is able to give a clear and coherent history. No signs of aphasia or neglect Cranial Nerves: II: Visual Fields are full. Pupils are equal, round, and reactive to light.   III,IV, VI: EOMI without ptosis or diploplia.  V: Facial sensation is symmetric to light touch VII: Facial movement is symmetric.  VIII: hearing is intact to voice X: Uvula elevates symmetrically XII: tongue is midline without atrophy or fasciculations.  Motor: She has significant giveaway weakness of all four extremities, but does have worse giveaway weakness on the right compared to the left Sensory: Sensation is diminished to light touch in the right arm, symmetric in the legs Cerebellar: No clear ataxia       Labs/Imaging/Neurodiagnostic studies   CBC:  Recent Labs  Lab 05-Dec-2023 1614 12-05-2023 1619  WBC 10.3  --   NEUTROABS 7.8*  --   HGB 15.6* 16.0*  HCT 46.1* 47.0*  MCV 92.9  --   PLT 270  --    Basic Metabolic Panel:  Lab Results  Component Value Date   NA 136  2023/12/05   K 5.9 (H) 2023/12/05   CO2 21 (L) 03/09/2020   GLUCOSE 98 05-Dec-2023   BUN 27 (H) 12/05/2023   CREATININE 1.30 (H) 12-05-2023   CALCIUM 8.3 (L) 03/09/2020   GFRNONAA >60 03/09/2020   GFRAA >60 03/09/2020  INR  Lab Results  Component Value Date   INR 1.0 11/10/2023   APTT  Lab Results  Component Value Date   APTT 30 11/10/2023   CT Head without contrast(Personally reviewed): Negative  CT angio Head and Neck with contrast(Personally reviewed): Negative    ASSESSMENT   Olivia [name] is a 53 y.o. female with acute onset of positive symptoms (paresthesia) as well as numbness and weakness of all four extremities.  She has also been feeling lightheaded and passed out earlier.  The description including sweatiness and lightheadedness prior does seem suggestive of vasovagal syncope.  I do think that ruling out acute ischemia with an MRI would be prudent, but my suspicion is that her neurological symptoms are likely related to complicated migraine due to the fact that she is having positive symptoms that are spreading in association with the bad headache yesterday and photophobia today.  She does have some findings on exam concerning for nonorganic etiology, but my suspicion is she likely has complicated migraine with embellishment.  RECOMMENDATIONS  MRI brain If negative would treat with migraine cocktail Chest pain workup per ED. ______________________________________________________________________    Signed, Ritta Slot, MD [facility]

## 2023-11-10 NOTE — ED Notes (Signed)
 Per MD Amada Jupiter and Dia Sitter, cancel code stroke

## 2023-11-10 NOTE — Discharge Instructions (Signed)
 As we discussed, your MRI did not show any stroke today.  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  I have referred you to neurology for follow-up  Please stay hydrated  Return to ER if you have worse chest pain or dizziness or passing out or numbness or weakness

## 2023-11-18 ENCOUNTER — Encounter: Payer: Self-pay | Admitting: Neurology

## 2023-11-18 ENCOUNTER — Ambulatory Visit: Admitting: Neurology

## 2023-11-18 VITALS — Ht 62.0 in | Wt 175.0 lb

## 2023-11-18 DIAGNOSIS — G959 Disease of spinal cord, unspecified: Secondary | ICD-10-CM

## 2023-11-18 DIAGNOSIS — M5412 Radiculopathy, cervical region: Secondary | ICD-10-CM | POA: Diagnosis not present

## 2023-11-18 DIAGNOSIS — R2990 Unspecified symptoms and signs involving the nervous system: Secondary | ICD-10-CM

## 2023-11-18 DIAGNOSIS — G43009 Migraine without aura, not intractable, without status migrainosus: Secondary | ICD-10-CM | POA: Insufficient documentation

## 2023-11-18 DIAGNOSIS — R42 Dizziness and giddiness: Secondary | ICD-10-CM

## 2023-11-18 MED ORDER — UBRELVY 100 MG PO TABS: 100.0000 mg | ORAL_TABLET | ORAL | Status: DC | PRN

## 2023-11-18 MED ORDER — QULIPTA 60 MG PO TABS: 60.0000 mg | ORAL_TABLET | Freq: Every day | ORAL | Status: AC

## 2023-11-18 NOTE — Patient Instructions (Addendum)
 Vertigo: ENT for the vertigo and vestibular therapy Johna Myers) Neck: Go see spine specialist for neck follow up, mri of the neck Treat migraines; dizziness is prevalent in migraines Insomnia/Brain fog: Cognitive behavioral Therapy, hydroxyzine can cause  Treat migraines: Qulipta preventative daily;  and ubrelvy as needed instead of the imitrex: Please take one tablet at the onset of your headache. If it does not improve the symptoms please take one additional tablet in 2 hours. Do not take more then 2 tablets in 24hrs.  Read about migraines with aura, if aura then triptans like sumatriptan are not recommended and no HRT   Discussed:  There is increased risk for stroke in women with migraine with aura and a contraindication for the combined contraceptive pill for use by women who have migraine with aura. The risk for women with migraine without aura is lower. However other risk factors like smoking are far more likely to increase stroke risk than migraine. There is a recommendation for no smoking and for the use of OCPs without estrogen such as progestogen only pills particularly for women with migraine with aura.Aaron Aas People who have migraine headaches with auras may be 3 times more likely to have a stroke caused by a blood clot, compared to migraine patients who don't see auras. Women who take hormone-replacement therapy may be 30 percent more likely to suffer a clot-based stroke than women not taking medication containing estrogen. Other risk factors like smoking and high blood pressure may be  much more important. And stroke is still a rare complication due to migraine aura and is controversial and lower doses may not cause a risk.  Ubrogepant Tablets What is this medication? UBROGEPANT (ue BROE je pant) treats migraines. It works by blocking a substance in the body that causes migraines. It is not used to prevent migraines. This medicine may be used for other purposes; ask your health care  provider or pharmacist if you have questions. COMMON BRAND NAME(S): Florette Hurry What should I tell my care team before I take this medication? They need to know if you have any of these conditions: Kidney disease Liver disease An unusual or allergic reaction to ubrogepant, other medications, foods, dyes, or preservatives Pregnant or trying to get pregnant Breast-feeding How should I use this medication? Take this medication by mouth with a glass of water. Take it as directed on the prescription label. You can take it with or without food. If it upsets your stomach, take it with food. Keep taking it unless your care team tells you to stop. Talk to your care team about the use of this medication in children. Special care may be needed. Overdosage: If you think you have taken too much of this medicine contact a poison control center or emergency room at once. NOTE: This medicine is only for you. Do not share this medicine with others. What if I miss a dose? This does not apply. This medication is not for regular use. What may interact with this medication? Do not take this medication with any of the following: Adagrasib Ceritinib Certain antibiotics, such as chloramphenicol, clarithromycin, telithromycin Certain antivirals for HIV, such as atazanavir, cobicistat, darunavir, delavirdine, fosamprenavir, indinavir, ritonavir Certain medications for fungal infections, such as itraconazole, ketoconazole, posaconazole, voriconazole Conivaptan Grapefruit Idelalisib Mifepristone Nefazodone Ribociclib This medication may also interact with the following: Carvedilol Certain medications for seizures, such as phenobarbital, phenytoin Ciprofloxacin Cyclosporine Eltrombopag Fluconazole Fluvoxamine Quinidine Rifampin St. John's wort Verapamil This list may not describe all possible interactions. Give your  health care provider a list of all the medicines, herbs, non-prescription drugs, or dietary  supplements you use. Also tell them if you smoke, drink alcohol, or use illegal drugs. Some items may interact with your medicine. What should I watch for while using this medication? Visit your care team for regular checks on your progress. Tell your care team if your symptoms do not start to get better or if they get worse. Your mouth may get dry. Chewing sugarless gum or sucking hard candy and drinking plenty of water may help. Contact your care team if the problem does not go away or is severe. What side effects may I notice from receiving this medication? Side effects that you should report to your care team as soon as possible: Allergic reactions--skin rash, itching, hives, swelling of the face, lips, tongue, or throat Side effects that usually do not require medical attention (report to your care team if they continue or are bothersome): Drowsiness Dry mouth Fatigue Nausea This list may not describe all possible side effects. Call your doctor for medical advice about side effects. You may report side effects to FDA at 1-800-FDA-1088. Where should I keep my medication? Keep out of the reach of children and pets. Store between 15 and 30 degrees C (59 and 86 degrees F). Get rid of any unused medication after the expiration date. To get rid of medications that are no longer needed or have expired: Take the medication to a medication take-back program. Check with your pharmacy or law enforcement to find a location. If you cannot return the medication, check the label or package insert to see if the medication should be thrown out in the garbage or flushed down the toilet. If you are not sure, ask your care team. If it is safe to put it in the trash, pour the medication out of the container. Mix the medication with cat litter, dirt, coffee grounds, or other unwanted substance. Seal the mixture in a bag or container. Put it in the trash. NOTE: This sheet is a summary. It may not cover all possible  information. If you have questions about this medicine, talk to your doctor, pharmacist, or health care provider.  2024 [employer] Standard (2021-09-13 00:00:00)Atogepant Tablets What is this medication? ATOGEPANT (a TOE je pant) prevents migraines. It works by blocking a substance in the body that causes migraines. This medicine may be used for other purposes; ask your health care provider or pharmacist if you have questions. COMMON BRAND NAME(S): QULIPTA What should I tell my care team before I take this medication? They need to know if you have any of these conditions: Kidney disease Liver disease An unusual or allergic reaction to atogepant, other medications, foods, dyes, or preservatives Pregnant or trying to get pregnant Breast-feeding How should I use this medication? Take this medication by mouth with water. Take it as directed on the prescription label at the same time every day. You can take it with or without food. If it upsets your stomach, take it with food. Keep taking it unless your care team tells you to stop. Talk to your care team about the use of this medication in children. Special care may be needed. Overdosage: If you think you have taken too much of this medicine contact a poison control center or emergency room at once. NOTE: This medicine is only for you. Do not share this medicine with others. What if I miss a dose? If you miss a dose, take it as soon  as you can. If it is almost time for your next dose, take only that dose. Do not take double or extra doses. What may interact with this medication? Carbamazepine Certain medications for fungal infections, such as itraconazole, ketoconazole Clarithromycin Cyclosporine Efavirenz Etravirine Phenytoin Rifampin St. John's wort This list may not describe all possible interactions. Give your health care provider a list of all the medicines, herbs, non-prescription drugs, or dietary supplements you use. Also tell  them if you smoke, drink alcohol, or use illegal drugs. Some items may interact with your medicine. What should I watch for while using this medication? Visit your care team for regular checks on your progress. Tell your care team if your symptoms do not start to get better or if they get worse. What side effects may I notice from receiving this medication? Side effects that you should report to your care team as soon as possible: Allergic reactions--skin rash, itching, hives, swelling of the face, lips, tongue, or throat Side effects that usually do not require medical attention (report to your care team if they continue or are bothersome): Constipation Fatigue Loss of appetite with weight loss Nausea This list may not describe all possible side effects. Call your doctor for medical advice about side effects. You may report side effects to FDA at 1-800-FDA-1088. Where should I keep my medication? Keep out of the reach of children and pets. Store at room temperature between 20 and 25 degrees C (68 and 77 degrees F). Get rid of any unused medication after the expiration date. To get rid of medications that are no longer needed or have expired: Take the medication to a medication take-back program. Check with your pharmacy or law enforcement to find a location. If you cannot return the medication, check the label or package insert to see if the medication should be thrown out in the garbage or flushed down the toilet. If you are not sure, ask your care team. If it is safe to put it in the trash, take the medication out of the container. Mix the medication with cat litter, dirt, coffee grounds, or other unwanted substance. Seal the mixture in a bag or container. Put it in the trash. NOTE: This sheet is a summary. It may not cover all possible information. If you have questions about this medicine, talk to your doctor, pharmacist, or health care provider.  2024 [employer]/Gold Standard (2021-09-09  00:00:00)

## 2023-11-18 NOTE — Progress Notes (Addendum)
 [facility]    Provider:  Dr Ines Requesting Provider: Patt Alm Macho, MD Primary Care Provider:  Adine Duwaine MATSU, FNP  CC:  dizziness  03/14/2024: Excellent response to ubrelvy , effective for termination of symptoms within an hour, continue.   HPI:  Olivia [name] is a 53 y.o. female here as requested by Patt Alm Macho, MD for dizziness. has Chronic headaches; Chronic pelvic pain in female; Constipation; Essential hypertension; Generalized anxiety disorder; Hypertriglyceridemia; Hypothyroidism, adult; OAB (overactive bladder); Primary insomnia; Renal calculus; Symptomatic postsurgical menopause; Family history of breast cancer in sister; and Migraine without aura and without status migrainosus, not intractable on their problem list. I reviewed notes and was recently in the ED 11/10/2023 Dr. Patt with acute oinset right-sided numbness and weakness in the setting of sever headache. Has a hx of migraines. A code stroke was called. CTA and MRI ordered, neurology consulted. Code stroke was canceled. Per Dr. Patt No obvious facial droop.  No obvious visual field cut.  Patient does have subjective decrease sensation of the right arm compared to the left.  Patient does have 4 out of 5 strength on the right arm compared to the left.  Patient has normal strength of the bilateral lower extremities.  Per Dr. Lenora, Olivia [name] is a 53 y.o. female with acute onset of positive symptoms (paresthesia) as well as numbness and weakness of all four extremities.  She has also been feeling lightheaded and passed out earlier.  The description including sweatiness and lightheadedness prior does seem suggestive of vasovagal syncope .SABRASABRAShe does have some findings on exam concerning for nonorganic etiology, but my suspicion is she likely has complicated migraine with embellishment   She is very dizzy, ongoing for months, she saw her primary care and did not mention the dizziness, last week she had a  migraine on Monday and took the medicine and she felt good, she always has soreness behind the right eye, the room is not spinning but she is spinning. Her eyes cannot focus on anything for a long period of time. She is dizzy all the time 24 x 7 worse when she lays down at night, worse when she lays down, worse with movement, she is a Interior and spatial designer, she feels her right arm is numb and her left arm is raw, she has weakness of the right arm, movement makes it worse, but not to one side or the other, she was going to a chiropractor, her neck is always crunching she has cepitus and decreased range of motion she has had surgery on her neck acdf and she was supposed to to go back to the spine specialist. She had covid 4 years ago. She feels like she is spinning right now. She has not been to an eye dotor but vision is changing, numbness in the arms and neck pain and prior surgery was supposed to follow up with spine specialist. She has a dull headache every day, she has nausea, dizziness, pulsating headache, sensory changes in the leg, abnormal movements in the right leg. She is taking daily ibuprofen, daughter has migraines. She has 4-5 migraine days a month and < 10 total headache days a month that can be pulsating, pounding, throbbing with dizziness, nausea, hurts ot move, light sensitivity. No other focal neurologic deficits, associated symptoms, inciting events or modifiable factors.   Reviewed notes, labs and imaging from outside physicians, which showed:  Meds: lexapro , reglan , seroquel , compazine , imitrex , topamax with side effects, cymbalta curently, propranolol contraindicated due to low blood  pressure and is on lasartan.   11/10/2023: MRi brain and CTA H&N    IMPRESSION: 1. No acute intracranial abnormality. 2. Mild chronic microvascular ischemic disease for age. 3. Question punctate density within the anterior chamber of the right globe. While this finding could potentially be artifactual nature,  a possible small foreign body could also be considered. Correlation with physical exam and direct visualization recommended.  IMPRESSION: 1. Negative CTA Head and Neck; no large vessel occlusion, atherosclerosis, or stenosis. 2. Cervical spine ACDF with appearance of solid arthrodesis.      Latest Ref Rng & Units 11/10/2023    4:19 PM 11/10/2023    4:14 PM 03/09/2020    1:37 PM  CBC  WBC 4.0 - 10.5 K/uL  10.3  8.1   Hemoglobin 12.0 - 15.0 g/dL 83.9  84.3  85.6   Hematocrit 36.0 - 46.0 % 47.0  46.1  44.2   Platelets 150 - 400 K/uL  270  218       Latest Ref Rng & Units 11/10/2023    4:19 PM 11/10/2023    4:14 PM 03/09/2020    1:37 PM  CMP  Glucose 70 - 99 mg/dL 98  99  895   BUN 6 - 20 mg/dL 27  17  16    Creatinine 0.44 - 1.00 mg/dL 8.69  8.83  8.94   Sodium 135 - 145 mmol/L 136  134  134   Potassium 3.5 - 5.1 mmol/L 5.9  5.6  3.9   Chloride 98 - 111 mmol/L 106  103  102   CO2 22 - 32 mmol/L  16  21   Calcium 8.9 - 10.3 mg/dL  9.6  8.3   Total Protein 6.5 - 8.1 g/dL  6.9  7.1   Total Bilirubin 0.0 - 1.2 mg/dL  0.6  0.2   Alkaline Phos 38 - 126 U/L  91  90   AST 15 - 41 U/L  42  87   ALT 0 - 44 U/L  31  123      Review of Systems: Patient complains of symptoms per HPI as well as the following symptoms none. Pertinent negatives and positives per HPI. All others negative.   Social History   Socioeconomic History   Marital status: Married    Spouse name: Not on file   Number of children: Not on file   Years of education: Not on file   Highest education level: Not on file  Occupational History   Not on file  Tobacco Use   Smoking status: Never   Smokeless tobacco: Never  Vaping Use   Vaping status: Never Used  Substance and Sexual Activity   Alcohol use: Never   Drug use: Never   Sexual activity: Not on file  Other Topics Concern   Not on file  Social History Narrative   Right handed   Caffeine: 1 cup/day   Social Drivers of Health   Financial Resource Strain:  Low Risk  (09/24/2023)   Received from [facility]   Overall Financial Resource Strain (CARDIA)    Difficulty of Paying Living Expenses: Not hard at all  Food Insecurity: No Food Insecurity (09/24/2023)   Received from [facility]   Hunger Vital Sign    Worried About Running Out of Food in the Last Year: Never true    Ran Out of Food in the Last Year: Never true  Transportation Needs: No Transportation Needs (09/24/2023)   Received from [facility]  PRAPARE - Administrator, Civil Service (Medical): No    Lack of Transportation (Non-Medical): No  Physical Activity: Insufficiently Active (09/24/2023)   Received from [facility]   Exercise Vital Sign    Days of Exercise per Week: 2 days    Minutes of Exercise per Session: 20 min  Stress: Patient Declined (09/24/2023)   Received from [facility] - Occupational Stress Questionnaire    Feeling of Stress : Patient declined  Social Connections: Socially Integrated (09/24/2023)   [facility]   Social Network    How would you rate your social network (family, work, friends)?: Good participation with social networks  Intimate Partner Violence: Not At Risk (09/24/2023)   Received from [facility]   HITS    Over the last 12 months how often did your partner physically hurt you?: Never    Over the last 12 months how often did your partner insult you or talk down to you?: Never    Over the last 12 months how often did your partner threaten you with physical harm?: Never    Over the last 12 months how often did your partner scream or curse at you?: Never    Family History  Problem Relation Age of Onset   Migraines Sister     Past Medical History:  Diagnosis Date   Asthma    Depression    Hypertension    Kidney stones    lithrotripsy x 4   Pulmonary fibrosis (HCC)     Patient Active Problem List   Diagnosis Date Noted   Migraine without aura and without  status migrainosus, not intractable 11/18/2023   Constipation 11/29/2018   Chronic pelvic pain in female 12/15/2017   Essential hypertension 01/07/2017   Symptomatic postsurgical menopause 06/10/2016   Family history of breast cancer in sister 06/10/2016   OAB (overactive bladder) 05/19/2016   Renal calculus 05/19/2016   Hypothyroidism, adult 12/17/2015   Chronic headaches 08/30/2015   Generalized anxiety disorder 06/22/2015   Hypertriglyceridemia 06/22/2015   Primary insomnia 06/22/2015    Past Surgical History:  Procedure Laterality Date   BLADDER SURGERY     CARPAL TUNNEL RELEASE Right    CERVICAL SPINE SURGERY     anterior approach, for ruptured disc   CHOLECYSTECTOMY     LITHOTRIPSY     x4   muscle repair in abdomen  2020   TOTAL HYSTERECTOMY      Current Outpatient Medications  Medication Sig Dispense Refill   Atogepant  (QULIPTA ) 60 MG TABS Take 1 tablet (60 mg total) by mouth daily.     Atogepant  (QULIPTA ) 60 MG TABS Take 1 tablet (60 mg total) by mouth daily. Please run copay card BIN [id number] PCN CNRX GRP [id number] ID [id number] EXP 08/03/2024 30 tablet 11   busPIRone (BUSPAR) 5 MG tablet Take 5 mg by mouth every morning.     cholecalciferol (VITAMIN D3) 25 MCG (1000 UNIT) tablet Take 1,000 Units by mouth daily.     DULoxetine (CYMBALTA) 60 MG capsule Take 60 mg by mouth daily.     levothyroxine (SYNTHROID) 50 MCG tablet TAKE 1 TABLET(50 MCG) BY MOUTH DAILY AT 6 AM     losartan (COZAAR) 50 MG tablet Take 50 mg by mouth daily.     Ubrogepant  (UBRELVY ) 100 MG TABS Take 1 tablet (100 mg total) by mouth every 2 (two) hours as needed. Maximum 200mg  a day.     No  current facility-administered medications for this visit.    Allergies as of 11/18/2023 - Review Complete 11/18/2023  Allergen Reaction Noted   Other Rash 06/28/2015   Sulfa antibiotics Rash 08/09/2014   Sulfasalazine Rash 06/28/2015   Silicone Rash 05/10/2018    Vitals: Ht 5' 2 (1.575 m)   Wt 175 lb  (79.4 kg)   BMI 32.01 kg/m  Last Weight:  Wt Readings from Last 1 Encounters:  11/18/23 175 lb (79.4 kg)   Last Height:   Ht Readings from Last 1 Encounters:  11/18/23 5' 2 (1.575 m)   Bp 120/80, resp 14   Physical exam: Exam: Gen: NAD, conversant, well nourised, obese, well groomed                     CV: RRR, no MRG. No Carotid Bruits. No peripheral edema, warm, nontender Eyes: Conjunctivae clear without exudates or hemorrhage  Neuro: Detailed Neurologic Exam  Speech:    Speech is normal; fluent and spontaneous with normal comprehension.  Cognition:    The patient is oriented to person, place, and time;     recent and remote memory intact;     language fluent;     normal attention, concentration,     fund of knowledge Cranial Nerves:    The pupils are equal, round, and reactive to light. The fundi are normal and spontaneous venous pulsations are present. Visual fields are full to finger confrontation. Extraocular movements are intact. Trigeminal sensation is intact and the muscles of mastication are normal. The face is symmetric. The palate elevates in the midline. Hearing intact. Voice is normal. Shoulder shrug is normal. The tongue has normal motion without fasciculations.   Coordination: nml  Gait: nml  Motor Observation:    No asymmetry, no atrophy, and no involuntary movements noted. Tone:    Normal muscle tone.    Posture:    Posture is normal. normal erect    Strength:    Strength is V/V in the upper and lower limbs.      Sensation: intact to LT     Reflex Exam:  DTR's:    Deep tendon reflexes in the upper and lower extremities are brisk bilaterally.   Toes:    The toes are downgoing bilaterally.   Clonus:    Clonus iAJs.    Assessment/Plan:  acute oinset right-sided numbness and weakness in the setting of sever headache. Has a hx of migraines. A code stroke was called. CTA and MRI ordered, neurology consulted. Code stroke was canceled. Per Dr.  Patt No obvious facial droop.  No obvious visual field cut.  Patient does have subjective decrease sensation of the right arm compared to the left.  Patient does have 4 out of 5 strength on the right arm compared to the left.  Patient has normal strength of the bilateral lower extremities.  Per Dr. Lenora, Anzlee Hinesley is a 53 y.o. female with acute onset of positive symptoms (paresthesia) as well as numbness and weakness of all four extremities.  She has also been feeling lightheaded and passed out earlier.  The description including sweatiness and lightheadedness prior does seem suggestive of vasovagal syncope .SABRASABRAShe does have some findings on exam concerning for nonorganic etiology, but my suspicion is she likely has complicated migraine with embellishment   Due to right-sided weakness, triptans contraindicated Discussed increased risk of stroke with migraine aura Vertigo: ENT for the vertigo and vestibular therapy mauro) Neck: Go see spine specialist for neck follow up,  mri of the neck: had prior surgery, chronic neck pain, suspicious fo rmyelopathy given abnormal relflexes and clonus Treat migraines; dizziness is prevalent in migraines, qulipta  and ubrelvy  Insomnia/Brain fog: Cognitive behavioral Therapy, hydroxyzine can cause symptoms may consider not taking this Discussed medication overuse headaches  Treat migraines: Qulipta  preventative daily;  and ubrelvy  as needed instead of the imitrex : Please take one tablet at the onset of your headache. If it does not improve the symptoms please take one additional tablet in 2 hours. Do not take more then 2 tablets in 24hrs.  Read about migraines with aura, if aura then triptans like sumatriptan  are not recommended and no HRT   Discussed:  There is increased risk for stroke in women with migraine with aura and a contraindication for the combined contraceptive pill for use by women who have migraine with aura. The risk for women with migraine  without aura is lower. However other risk factors like smoking are far more likely to increase stroke risk than migraine. There is a recommendation for no smoking and for the use of OCPs without estrogen such as progestogen only pills particularly for women with migraine with aura.SABRA People who have migraine headaches with auras may be 3 times more likely to have a stroke caused by a blood clot, compared to migraine patients who don't see auras. Women who take hormone-replacement therapy may be 30 percent more likely to suffer a clot-based stroke than women not taking medication containing estrogen. Other risk factors like smoking and high blood pressure may be  much more important. And stroke is still a rare complication due to migraine aura and is controversial and lower doses may not cause a risk.  Orders Placed This Encounter  Procedures   MR CERVICAL SPINE WO CONTRAST   Ambulatory referral to ENT   Ambulatory referral to Physical Therapy   Meds ordered this encounter  Medications   Ubrogepant  (UBRELVY ) 100 MG TABS    Sig: Take 1 tablet (100 mg total) by mouth every 2 (two) hours as needed. Maximum 200mg  a day.   Atogepant  (QULIPTA ) 60 MG TABS    Sig: Take 1 tablet (60 mg total) by mouth daily.   Atogepant  (QULIPTA ) 60 MG TABS    Sig: Take 1 tablet (60 mg total) by mouth daily. Please run copay card BIN [id number] PCN CNRX GRP [id number] ID [id number] EXP 08/03/2024    Dispense:  30 tablet    Refill:  11    Please run copay card BIN [id number] PCN CNRX GRP FERDIE ID [id number] EXP 08/03/2024    Cc: Patt Alm Macho, MD,  Adine Duwaine MATSU, FNP  Onetha Epp, MD  [facility] [address] [place], [place] [address]  Phone [phone] Fax [phone]

## 2023-11-23 ENCOUNTER — Telehealth: Payer: Self-pay | Admitting: Neurology

## 2023-11-23 MED ORDER — QULIPTA 60 MG PO TABS: 60.0000 mg | ORAL_TABLET | Freq: Every day | ORAL | 11 refills | Status: AC

## 2023-11-23 NOTE — Addendum Note (Signed)
 Addended by: Zai Chmiel B on: 11/23/2023 10:34 AM   Modules accepted: Level of Service

## 2023-11-23 NOTE — Telephone Encounter (Signed)
 Referral for ENT fax to [facility] [place]. Phone: [phone], Fax: [phone]

## 2023-11-24 ENCOUNTER — Telehealth: Payer: Self-pay | Admitting: Pharmacist

## 2023-11-24 NOTE — Telephone Encounter (Signed)
 Pharmacy Patient Advocate Encounter   Received notification from Patient Pharmacy that prior authorization for Qulipta  60MG  tablets is required/requested.   Insurance verification completed.   The patient is insured through [facility] .   Per test claim: PA required; PA submitted to above mentioned insurance via CoverMyMeds Key/confirmation #/EOC [facility] Status is pending

## 2023-11-25 ENCOUNTER — Encounter: Payer: Self-pay | Admitting: *Deleted

## 2023-11-25 NOTE — Telephone Encounter (Signed)
 Pharmacy Patient Advocate Encounter  Received notification from [facility] that Prior Authorization for Qulipta  60MG  tablets has been DENIED.  Full denial letter will be uploaded to the media tab. See denial reason below.   PA #/Case ID/Reference #: [id number]

## 2023-11-25 NOTE — Telephone Encounter (Signed)
 Can this possibly be reviewed for appeal? It was prescribed for episodic migraine management and the injectables are only FDA approved for chronic migraines.

## 2023-11-26 ENCOUNTER — Telehealth: Payer: Self-pay | Admitting: Pharmacist

## 2023-11-26 NOTE — Telephone Encounter (Signed)
 [employer] Prior Authorization [facility] for medication has been denied. Will faxing notification shortly. [id number] option 3 option 1

## 2023-11-26 NOTE — Telephone Encounter (Signed)
 An E-Appeal has been submitted for Qulipta . Will advise when response is received, please be advised that most companies may take 30 days to make a decision.  Appeal letter and supporting documentation have been uploaded and submitted via CMM website on 11/26/2023 @2 :45 pm.  Thank you, Dene Fines, PharmD Clinical Pharmacist  [place]  Direct Dial: [phone]

## 2023-11-26 NOTE — Telephone Encounter (Signed)
 An E-Appeal has been submitted for Qulipta . Will advise when response is received, please be advised that most companies may take 30 days to make a decision. Appeal letter and supporting documentation have been uploaded and submitted via the [facility] website.  Thank you, Dene Fines, PharmD Clinical Pharmacist  [place]  Direct Dial: [phone]

## 2023-12-01 ENCOUNTER — Other Ambulatory Visit: Payer: Self-pay | Admitting: Neurology

## 2023-12-01 DIAGNOSIS — G43009 Migraine without aura, not intractable, without status migrainosus: Secondary | ICD-10-CM

## 2023-12-01 MED ORDER — UBRELVY 100 MG PO TABS
100.0000 mg | ORAL_TABLET | ORAL | 11 refills | Status: AC | PRN
Start: 2023-12-01 — End: ?

## 2023-12-01 NOTE — Telephone Encounter (Signed)
 The appeal for Qulipta  was denied by the insurance:

## 2023-12-01 NOTE — Telephone Encounter (Signed)
 Is this the appeal?

## 2023-12-02 ENCOUNTER — Ambulatory Visit: Admitting: Rehabilitative and Restorative Service Providers"

## 2023-12-02 ENCOUNTER — Encounter: Payer: Self-pay | Admitting: Neurology

## 2023-12-02 NOTE — Telephone Encounter (Signed)
 I will reach out to patient and clarify how many headache/migraine days she has

## 2023-12-03 ENCOUNTER — Telehealth: Payer: Self-pay

## 2023-12-03 ENCOUNTER — Other Ambulatory Visit (HOSPITAL_COMMUNITY): Payer: Self-pay

## 2023-12-03 NOTE — Telephone Encounter (Signed)
 Appeal for Qulipta  was denied by insurance for the following reasons:

## 2023-12-03 NOTE — Telephone Encounter (Signed)
 Pharmacy Patient Advocate Encounter   Received notification from CoverMyMeds that prior authorization for Ubrelvy  100MG  tablets is required/requested.   Insurance verification completed.   The patient is insured through [facility] .   Per test claim: PA required; PA submitted to above mentioned insurance via CoverMyMeds Key/confirmation #/EOC [facility] Status is pending

## 2023-12-04 ENCOUNTER — Other Ambulatory Visit (HOSPITAL_COMMUNITY): Payer: Self-pay

## 2023-12-04 NOTE — Telephone Encounter (Signed)
 Pharmacy Patient Advocate Encounter  Received notification from [facility] that Prior Authorization for Ubrelvy  100MG  tablets has been APPROVED from 12/03/2023 to 02/25/2024. Ran test claim, Copay is $0. This test claim was processed through [facility]- copay amounts may vary at other pharmacies due to pharmacy/plan contracts, or as the patient moves through the different stages of their insurance plan.   PA #/Case ID/Reference #: PA Case ID #: [id number]

## 2023-12-09 ENCOUNTER — Encounter: Payer: Self-pay | Admitting: Neurology

## 2023-12-09 ENCOUNTER — Ambulatory Visit

## 2023-12-09 DIAGNOSIS — G959 Disease of spinal cord, unspecified: Secondary | ICD-10-CM

## 2023-12-09 DIAGNOSIS — M5412 Radiculopathy, cervical region: Secondary | ICD-10-CM | POA: Diagnosis not present

## 2023-12-09 DIAGNOSIS — R2990 Unspecified symptoms and signs involving the nervous system: Secondary | ICD-10-CM | POA: Diagnosis not present

## 2024-02-26 ENCOUNTER — Other Ambulatory Visit (HOSPITAL_COMMUNITY): Payer: Self-pay

## 2024-03-10 ENCOUNTER — Other Ambulatory Visit (HOSPITAL_COMMUNITY): Payer: Self-pay

## 2024-03-10 ENCOUNTER — Telehealth: Payer: Self-pay

## 2024-03-10 NOTE — Telephone Encounter (Signed)
 It is time to renew PA for Ubrelvy , PT has not been evaluated since starting medication. Insurance is asking for documentation supporting how the medication is working for the Pt.

## 2024-03-14 ENCOUNTER — Other Ambulatory Visit (HOSPITAL_COMMUNITY): Payer: Self-pay

## 2024-03-14 NOTE — Telephone Encounter (Signed)
 Pharmacy Patient Advocate Encounter   Received notification from Physician's Office that prior authorization for Ubrelvy  100mg  Tablets is required/requested.   Insurance verification completed.   The patient is insured through [facility] .   Per test claim: PA required; PA submitted to above mentioned insurance via Latent Key/confirmation #/EOC [facility] Status is pending

## 2024-03-15 ENCOUNTER — Other Ambulatory Visit (HOSPITAL_COMMUNITY): Payer: Self-pay

## 2024-03-15 NOTE — Telephone Encounter (Signed)
 Pharmacy Patient Advocate Encounter  Received notification from [facility] that Prior Authorization for Ubrelvy  100MG  tablets has been APPROVED from 03/14/2024 to 03/14/2025. Ran test claim, Copay is $0. This test claim was processed through [facility]- copay amounts may vary at other pharmacies due to pharmacy/plan contracts, or as the patient moves through the different stages of their insurance plan.   PA #/Case ID/Reference #: PA Case ID #: [id number]

## 2024-05-19 ENCOUNTER — Ambulatory Visit: Admitting: Adult Health

## 2024-06-22 ENCOUNTER — Telehealth: Payer: Self-pay | Admitting: Pharmacist

## 2024-06-22 NOTE — Telephone Encounter (Signed)
 Pharmacy Patient Advocate Encounter   Received notification from Patient Pharmacy that prior authorization for Ubrelvy  100MG  tablets is required/requested.   Insurance verification completed.   The patient is insured through [employer].   Per test claim: PA required; PA submitted to above mentioned insurance via Latent Key/confirmation #/EOC [id number] Status is pending

## 2024-06-22 NOTE — Telephone Encounter (Signed)
 Pharmacy Patient Advocate Encounter  Received notification from [employer] that Prior Authorization for UBRELVY  100 MG PO TABS has been APPROVED from 06/22/2024 to 06/22/2025   PA #/Case ID/Reference #: [id number]
